# Patient Record
Sex: Male | Born: 1955 | Race: White | Hispanic: No | Marital: Married
Health system: Midwestern US, Community
[De-identification: ages and names within clinical notes are randomized; demographics above are authoritative.]

---

## 2013-06-01 MED ORDER — PREDNISONE 10 MG PO TABS
10 MG | ORAL_TABLET | ORAL | Status: AC
Start: 2013-06-01 — End: 2013-06-11

## 2013-06-01 NOTE — Progress Notes (Signed)
Subjective:      Patient ID: Nicholas Zuniga is a 57 y.o. male.    Back Pain  This is a new problem. The current episode started more than 1 month ago. The problem occurs constantly. The problem is unchanged. The pain is present in the gluteal. The quality of the pain is described as burning. The pain does not radiate. The pain is moderate. The symptoms are aggravated by sitting. Stiffness is present all day. The treatment provided mild relief.     Patient is a recent cardiac surgery back in May 29.    Since that time patient reports low back pain with right anterior thigh pain and left second and third toes numbness and tingling.  remainder of left leg is light past    Pain is worse with sitting goes away completely with lying down is aggravated moderately with activity especially stair    History of relief while taking Medrol Dosepak  Review of Systems   Musculoskeletal: Positive for back pain.       Objective:   Physical Exam   Constitutional: He is oriented to person, place, and time. He appears well-developed and well-nourished.   HENT:   Head: Normocephalic and atraumatic.   Eyes: Conjunctivae and EOM are normal.   Neck: Normal range of motion.   Cardiovascular:   Weak pedal pulses    Mild dependent rubor   Pulmonary/Chest: Effort normal. No respiratory distress.   Neurological: He is alert and oriented to person, place, and time. He has normal strength. No sensory deficit.   Normal gait   Skin: Skin is warm and dry.   Psychiatric: His behavior is normal. Thought content normal.   Nursing note and vitals reviewed.    MRI lumbar spine as well as AP lateral lumbar spine obtained and reviewed by myself in clinic today patient at L5-S1 disc space collapse some degree of foraminal stenosis hips appear normal  Assessment:      Encounter Diagnoses   Name Primary?   ??? Low back pain Yes   ??? Lumbar radicular pain    ??? Right hip pain    ??? PAD (peripheral artery disease) (HCC)             Plan:      Noninvasive vascular  studies    prednisone    Lumbar epidural steroid injections    Physical therapy

## 2013-06-02 NOTE — Addendum Note (Signed)
Addended by: Precious ReelLOPEZ, KARA on: 06/02/2013 11:02 AM     Modules accepted: Medications

## 2013-06-07 NOTE — Telephone Encounter (Signed)
Was here last week..has P.T. And epidurals ordered. Epidurals won't start till 9/8. 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 Please call back to advise if he can get work extension.

## 2013-06-10 NOTE — Telephone Encounter (Signed)
Per Dr. Dion BodyBeeks okay to write. Wrote letter will give to [place]

## 2013-06-10 NOTE — Telephone Encounter (Signed)
Ok give note

## 2013-06-10 NOTE — Telephone Encounter (Signed)
Patient called in on 8/18, some how the message was closed in error:            Expand All Collapse All   Was here last week..has P.T. And epidurals ordered. Epidurals won't start till 9/8. 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 Please call back to advise if he can get work extension.           Patient would like a note keeping him off work until 07/21/13, so PT and Injections will be done.   Please give the letter to Zella BallRobin to take home, patient to pick up at her home.

## 2013-06-16 MED ORDER — HYDROCODONE-ACETAMINOPHEN 5-325 MG PO TABS
5-325 MG | ORAL_TABLET | ORAL | Status: AC | PRN
Start: 2013-06-16 — End: 2013-06-23

## 2013-06-16 NOTE — Telephone Encounter (Signed)
Patient calling to get Norco refill said you gave it to him before. I do not see anything in our system that you gave patient Norco> Okay to call in?

## 2013-06-16 NOTE — Telephone Encounter (Signed)
Per Dr. Dion BodyBeeks okay call in #[phone]

## 2013-06-16 NOTE — Telephone Encounter (Signed)
Called in prescription, and called patient to let know had to leave message.

## 2013-06-28 LAB — URINE DRUG SCREEN
Amphetamine Screen, Ur: NEGATIVE
Barbiturate Screen, Ur: NEGATIVE
Benzodiazepine Screen, Urine: NEGATIVE
Cannabinoid Scrn, Ur: NEGATIVE
Cocaine Metabolite, Urine: NEGATIVE
Methadone Screen, Urine: NEGATIVE
Opiates, Urine: NEGATIVE
Oxycodone Screen, Ur: NEGATIVE
Phencyclidine, Urine: NEGATIVE

## 2013-06-28 MED ORDER — GABAPENTIN 400 MG PO CAPS
400 MG | ORAL_CAPSULE | Freq: Three times a day (TID) | ORAL | Status: DC
Start: 2013-06-28 — End: 2017-12-19

## 2013-06-28 NOTE — Progress Notes (Signed)
[name]  Patient Pain Assessment  Consultation - Nicholas Holts, MD    Primary Care Physician: Dorinda Hill, DO    Chief complaint:   Chief Complaint   Patient presents with   ??? Back Pain     right leg   .    HISTORY OF PRESENT ILLNESS:    Nicholas Zuniga is 57 y.o. male with chief c/o lower back pain that radiates to his right leg. 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 His lower back pain has improved slightly since the last visit to his spine surgeon. He is not on any pain medications now. He is quite active and has started feeling better. He c/o tingling and numbness in his right lateral thigh since the recent CABG surgery.     He suffered a small heart attack prior to he CABG. He notes his low back pain is of recent onset and does not recall any prior injury, or discomfort.  He was driving a truck and felt ' empty in arms, nothing in his chest, and a pain in his chest, worked 6 hours and went to the hospital. He was told he had an MI, given Plavix, he had a cath done and surgery following that.     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 He is off of work now since his CABG. He is a smoker.     Back Pain  This is a new problem. Episode onset: since 3 months. The problem occurs 2 to 4 times per day. The problem has been gradually improving since onset. The pain is present in the lumbar spine. The quality of the pain is described as aching, stabbing and shooting. The pain radiates to the right thigh. The pain is at a severity of 1/10. The pain is mild. The pain is worse during the day. The symptoms are aggravated by sitting, standing, twisting, position and bending (no pain with lying down.). Stiffness is present in the morning. Associated symptoms include leg pain, numbness and tingling (low back, right thigh, 2nd,3rd toes numb). Pertinent negatives include no fever, paresis,  paresthesias, perianal numbness, weakness or weight loss. Risk factors: chronic smoker. He has tried analgesics, bed rest, home exercises and walking for the symptoms. The treatment provided moderate relief.       OARRS compliant? not applicable  Concern for prescription abuse?not applicable    Current Pain Assement  Pain Assessment  Pain Assessment: 0-10  Pain Level: 1  Pain Type: Chronic pain  Pain Location: Back, Leg  Pain Orientation: Right  Pain Radiating Towards:  (down front of right leg)  Pain Descriptors: Spasm, Shooting, Sharp, Constant, Aching, Numbness (knife like, electric, numb right thigh, mid numb)  Pain Frequency: Intermittent  Pain Onset: On-going (June 4, when he left the hospital pain)  Clinical Progression: Not changed  Effect of Pain on Daily Activities:  (usually the next day after doing activities cutting wood, severe pain, stops me.)  Patient's Stated Pain Goal: 1 (Get rid of the numbness in right leg)  Pain Intervention(s): Medication (see eMar), Rest, Repositioned, Heat applied  Response to Pain Intervention: None       Past Medical History      Diagnosis Date   ??? Hypertension    ??? Hyperlipidemia    ??? Heart attack (HCC)    ??? Osteoarthritis    ??? CAD (coronary  artery disease)        Surgical History  Past Surgical History   Procedure Laterality Date   ??? Neck surgery     ??? Cardiac surgery       open heart, triple bypass       Medications  Current Outpatient Prescriptions   Medication Sig Dispense Refill   ??? b complex vitamins capsule Take 1 capsule by mouth daily.       ??? vitamin D (ERGOCALCIFEROL) 400 UNITS CAPS Take 400 Units by mouth daily.       ??? gabapentin (NEURONTIN) 400 MG capsule Take 1 capsule by mouth 3 times daily for 30 days. Take one at bed time x 3 days then one twice a day 6am 6pm x 3 days then one three times a day 6am 2pm 10pm  90 capsule  2   ??? aspirin 81 MG tablet Take 81 mg by mouth daily.       ??? Cimetidine (ACID REDUCER PO) Take 20.6 mg by mouth.       ???  HYDROcodone-acetaminophen (NORCO) 5-325 MG per tablet Take 1 tablet by mouth every 6 hours as needed for Pain.       ??? metoprolol (LOPRESSOR) 100 MG tablet Take 100 mg by mouth 2 times daily.       ??? atorvastatin (LIPITOR) 40 MG tablet Take 40 mg by mouth daily.       ??? omeprazole (PRILOSEC) 20 MG capsule Take 20 mg by mouth daily.         No current facility-administered medications for this encounter.       Allergies  Review of patient's allergies indicates no known allergies.    Family History  family history includes Cancer in his father.    Social History  History     Social History   ??? Marital Status: Single     Spouse Name: N/A     Number of Children: N/A   ??? Years of Education: N/A     Occupational History   ??? truck Environmental health practitioner     Social History Main Topics   ??? Smoking status: Current Every Day Smoker -- 0.25 packs/day for 45 years     Types: Cigarettes   ??? Smokeless tobacco: Never Used   ??? Alcohol Use: 7.2 oz/week     12 Cans of beer per week      Comment: 12 beers a week   ??? Drug Use: No   ??? Sexual Activity: None     Other Topics Concern   ??? None     Social History Narrative      reports that he does not use illicit drugs.         ADVERSE MEDICATION EFFECTS:   Constipation: no  Bowel Regimen: Yes  Diet: low fat, low sodium and low cholesterol  Sedation:  no  Urinary Retention: no    FOCUSED PAIN SCALE:  Highest : 10  Lowest :1  Average: Range-2    ACTIVITY/SOCIAL/EMOTIONAL:  Sleep Pattern: 8 hours per night. generally restful sleep  Home Exercises: three times a week swimming, yard work and aquatic therapy  Currently attending Physical Therapy:  Yes, aquatic therapy  When and What  was your last procedure: N/A     Was your procedure effective:  not applicable  Emotional Issues: anger.   Currently seeing a Psychiatrist or Psychologist:  No  Appetite:  ok  Energy Level:  Normal  Mobility: pain right knee, right thigh  numb    Mood: appropriate     ABERRANT BEHAVIORS SINCE LAST VISIT:  Have you  ever been treated in another Pain Clinic no  Refills for prescriptions appropriate: not applicable  Lost rx/pills: not applicable  Taking more medication than prescribed:  no  Are you receiving PAIN medications from  other doctors: yes  Urine Drug Screen or pill count compliant:  not applicable  Brought pill bottles in :no  Recent ER visits: Yes, May 2014  MI, open heart triple bypass    REVIEW OF SYSTEMS:  Review of Systems   Constitutional: Negative.  Negative for fever and weight loss.   HENT: Negative.    Eyes: Negative.    Respiratory: Negative.    Cardiovascular: Negative.    Genitourinary: Negative.    Musculoskeletal: Positive for back pain and joint pain (right knee).   Skin: Negative.    Neurological: Positive for tingling (low back, right thigh, 2nd,3rd toes numb) and numbness. Negative for seizures, weakness and paresthesias.   Endo/Heme/Allergies: Negative.    Psychiatric/Behavioral: Negative.             GENERAL PHYSICAL EXAM:  Vitals: BP 146/87   Pulse 88   Temp(Src) 98 ??F (36.7 ??C) (Oral)   Resp 20   Ht 5\' 7"  (1.702 m)   Wt 190 lb (86.183 kg)   BMI 29.75 kg/m2   SpO2 96%, Body mass index is 29.75 kg/(m^2).  GENERAL APPEARANCE: Appears well nourished and in no acute distress and not in severe pain at this time.   SKIN: Warm, dry, no cyanosis or jaundice.   HEAD: Normocephalic, atraumatic, no swelling or tenderness.   EYES: Pupils equal, reactive to light.   EARS: No discharge, no marked hearing loss.   NOSE: No rhinorrhea, epistaxis or septal deformity.   THROAT: Not congested. No ulceration bleeding or discharge.   NECK: No stiffness, trachea central. No palpable masses or L.N.   CHEST: Symmetrical and equal on expansion.   HEART:  No audible murmurs or gallops.   LUNGS: Equal on expansion, normal breath sounds. No adventitious sounds.   ABDOMEN: Soft on palpation. No localized tenderness. No guarding or rigidity. No palpable organomegaly.   LYMPHATICS: No palpable cervical lymphadenopathy.      LOCOMOTOR, BACK/ SPINE and EXTREMITIES:   NEUROLOGICAL:  Back Exam     Tenderness   The patient is experiencing tenderness in the lumbar.    Range of Motion   Extension: normal   Flexion: normal   Lateral Bend Right: normal   Lateral Bend Left: normal   Rotation Right: abnormal   Rotation Left: abnormal     Muscle Strength   Right Quadricep:  5/5   Left Quadricep:  5/5   Right Hamstring:  5/5   Left amstring:  5/5     Tests   Straight leg raise right: positive         Reflexes   Patellar: 2/4  Achilles: Hyporeflexic (on the right it is 0-1+ and 2+ on the left. )    Other   Toe Walk: normal  Heel Walk: normal  Sensation: decreased (Right lateral thigh)  Gait: normal   Erythema: no back redness  Scars: absent          Physical Exam   Musculoskeletal:        Legs:  He has bilateral lumbar facet arthritis and tenderness. He appears to have decreased sensation along the right lateral thigh; his achilles reflex is slightly diminished on the  right compared to the left.          DATA  Labs:  No components found with this basename: iammenta,  labbarb,  labbenz,  cocainescrn,  imarthc,  labopia,  labphen        Imaging:  Radiology Images and Reports reviewed where indicated and necessary       Procedure: MRI LUMBAR WITHOUT CONTRAST    CMR 05/18/2013 [id number]   Reason for Exam: ^herniated disc with radiculopathy      FULL RESULT: MRI lumbar spine without intravenous contrast, 05/18/2013      History: Herniated desk      Comparison: None available      Technique: Multiplanar multisequence MR imaging of the lumbar spine was    performed without intravenous contrast      Findings:: There is straightening of normal lumbar lordosis seen.    Visualized spinal cord demonstrates no evidence for cord edema or    myelomalacia. Conus terminates at approximately L1 vertebral body level.    Cauda equina nerve roots follow spinal curvature      Vertebral body heights are maintained. Heterogeneous marrow signal    intensity noted with the  vertebral body hemangioma seen at T12 and L2    levels. Impression degenerative changes also seen at multiple levels with    loss of disc height and disc desiccation at L5-S1 level. Mild disc    desiccation also seen L4-5 level.      L1-L2 level: No significant central canal stenosis or neuroforaminal    narrowing is seen. Mild bilateral facet arthrosis along with ligamentum    flavum thickening noted      L2-L3 level: There is bilateral facet arthrosis noted with a broad-based    disc bulge and small foraminal level disc protrusions resulting in    minimal encroachment of the neural foramina without significant central    canal stenosis.      L3-L4 level: There is bilateral facet arthrosis noted with ligamentum    flavum thickening without significant central canal stenosis. Mild    encroachment of the inferior aspect of the neural foramina noted with a    broad-based disc bulge      L4-L5 level: Broad-based disc bulge noted with a central and left    foraminal level disc bulge resulting in moderate left-sided    neuroforaminal narrowing and mild right-sided neuroforaminal narrowing    along with mild deformity of the ventral thecal sac.      L5-S1 level: Bilateral facet arthrosis and Broad-based disc bulge noted    with the foraminal level disc protrusions resulting in mild approximation    of the bilateral S1 nerve roots. Moderate to severe bilateral    neuroforaminal narrowing is seen at this level      Paraspinous soft tissues demonstrate no discrete mass. Endplate    degenerative changes are seen at L5-S1 level.      There is mild ectasia of the abnormal aorta noted.      No evidence for abnormal intracanalicular mass or fluid collections are    seen.            IMPRESSION: Lumbar spondylotic changes with a broad-based disc bulge    and foraminal level disc protrusions noted L5-S1 level resulting in    approximation of the S1 nerve roots in moderate to severe bilateral    neuroforaminal narrowing.      Report  Electronically signed by Tonette Bihari, M.D. on 05/18/2013 6:02 PM  ASSESSMENT  KOBIE WHIDBY is a 57 y.o. male with    1. Low back pain    2. Lumbar radicular pain    3. Degeneration of lumbar or lumbosacral intervertebral disc    4. Lumbosacral spondylosis without myelopathy         PLAN        Patient's  MRI studies  Reviewed.    These findings are consistent with the patient's   symptoms and physical examination.        []  Bone scan   []  EMG and nerve conduction studies   [x]  Referral reports    I also discussed  the following treatment options Including advantages and disadvantages.   [x]  Physical therapy    [x]  Interventional pain treatment   [x]  Medication management   []  Surgical options    Patient's OARRS were reviewed. It is acceptable and appears patient is not receiving prescriptions from multiple prescribers.    Patient is  forthcoming as far as receipt of prescriptions for pain medication in the past.    The following screen was  also reviewed.    We will continue current pain medications.    Current medications are being tolerated without any Adverse side effects.  Orders Placed This Encounter   Medications   ??? gabapentin (NEURONTIN) 400 MG capsule     Sig: Take 1 capsule by mouth 3 times daily for 30 days. Take one at bed time x 3 days then one twice a day 6am 6pm x 3 days then one three times a day 6am 2pm 10pm     Dispense:  90 capsule     Refill:  2       Urine drug screens have been appropriate.  No aberrant activity noted.    Analgesia is achieved.   Activities of daily living are possible because of medications.     Safe use of medications explained to patient.     SOAPP- the score is 1  Which indicates patient is  <31minimal potential  4-7 Moderate potential  >7 High potential  for drug addiction.      Counselling/Preventive measures for pain  Control:    [x]   Spine strengthening exercises are discussed with patient in detail.     [x]  Ill effects of being on chronic pain medications  such as sleep disturbances, hormonal changes, withdrawal symptoms,  chronic opioid dependence and tolerance were discussed with patient. I had asked the patient to minimize medication use and utilize pain medications only for uncontrolled rest pain or pain with exertional activities. I advised patient not to self escalate pain medications without consulting with [place].    [x] The patient's questions were answered to the best of my abilities.    Return in  as needed; after completing physical therapy, for a quick re evaluation,   with Nicholas Zuniga M.D.  for further plan of treatment.    Dr.Mitzi Hadrian Yarbrough M.D.    Electronically signed by Karilyn Cota, MD on 06/28/2013 at 2:38 PM    Electronically signed by Karilyn Cota, MD on 06/28/2013 at 2:19 PM

## 2013-06-28 NOTE — Discharge Instructions (Signed)
It is now necessary for you to bring medications precsribed by this office with you to each visit. Please ensure that all medications are in the original bottles.   Failure to comply will result in a delay in the release of future medications.     Prescription given to pt. For neurontin

## 2013-07-02 LAB — OPIATE, QUANTITATIVE, URINE
Codeine, Urine: <20 ng/mL
Hydrocodone, Urine Confirmation: 233 ng/mL
Hydromorphone, Urine Confirmation: <20 ng/mL
Morphine, Urine Confirmation: <20 ng/mL
Norhydrocodone, Urine: 132 ng/mL
Noroxycodone, Urine: <20 ng/mL
Noroxymorphone, Urine: <20 ng/mL
Opiate, 6-AM Urine: <20 ng/mL
Opiate, Urine Interpretation: POSITIVE
Oxycodone, Urine Confirmation: <20 ng/mL
Oxymorphone, Urine Confirmation: <20 ng/mL

## 2013-07-23 MED ORDER — SODIUM CHLORIDE 0.9% INTERMITTENT INFUSION
0.9 % | Freq: Once | INTRAVENOUS | Status: AC
Start: 2013-07-23 — End: 2013-07-23
  Administered 2013-07-23: 13:00:00 via INTRAVENOUS

## 2013-07-23 MED ORDER — METOPROLOL TARTRATE 1 MG/ML IV SOLN
1 MG/ML | INTRAVENOUS | Status: DC | PRN
Start: 2013-07-23 — End: 2013-07-24

## 2013-07-23 MED ORDER — NITROGLYCERIN 0.4 MG SL SUBL
0.4 MG | SUBLINGUAL | Status: DC | PRN
Start: 2013-07-23 — End: 2013-07-24

## 2013-07-23 MED ORDER — NORMAL SALINE FLUSH 0.9 % IV SOLN
0.9 % | INTRAVENOUS | Status: DC | PRN
Start: 2013-07-23 — End: 2013-07-24
  Administered 2013-07-23: 13:00:00 via INTRAVENOUS

## 2013-07-23 NOTE — Procedures (Signed)
[facility]                     [address] [place], [place] [address]                                [phone]                       Cardiovascular Diagnostics Department    Patient Name: Nicholas Zuniga, Nicholas Zuniga                    Exam Date: 07/23/2013  CI Number: [id number]                             Document ID: [id number]  Visit Number: [id number]                        Age: 58  MR Number: [id number]                              DOB: 1956-07-29  Ordering Physician:                               Gender: M  PCP: Dorinda Hill, D.O.                          Room: OP  Attending Physician: Dorinda Hill, D.O.          Location: OP      TEST TYPE:  RADIOISOTOPE STRESS STUDY    INDICATION FOR STUDY:  CORONARY ARTERY DISEASE, HTN    INTERPRETATION    100% MAX PREDICTED HR:  162 bpm  85% OF MAX PREDICTED HR: 137 bpm  RESTING HEART RATE: 73 bpm  MAXIMUM HR ACHIEVED: 148 bpm    EQUAL TO 91% OF AGE PREDICTED MAXIMUM  RESTING BLOOD PRESSURE:  163/60.  PEAK BLOOD PRESSURE:   217/98.  PEAK DOUBLE PRODUCT:  25,366  REASON FOR TERMINATION:  Criteria reached.  NUMBER OF MINUTES ON THE TREADMILL:  8 minutes and 10 seconds.  PROTOCOL:  Bruce.  METS:  10.4  RESTING ECG:  Abnormal.  STRESS HEART RESPONSE:  Normal response.  STRESS BP RESPONSE:  Appropriate.  STRESS ECG's:  Normal.  CHEST DISCOMFORT:  No pain during stress.  ISCHEMIC ECG CHANGES:  None.      FINAL IMPRESSION:  Electrocardiographically Negative Stress Study.  Nuclear  scan report to follow.      "In order to promptly notify physicians concerning their patients, this  document is being released. It is not considered final until the  physician's authentication is found below."      Armond Hang, M.D. dv  Dict: 07/23/2013  Trans: 07/23/2013 02:18 P    cc:

## 2013-07-27 NOTE — Progress Notes (Signed)
Subjective:      Patient ID: Nicholas Zuniga is a 58 y.o. male.    HPI  Please refer to previous clinic note    Patient reports he was improving by the time he was seen by pain management they started him on Neurontin rather than giving him epidurals    Patient reports his pain has subsequently completely resolved   Review of Systems    Objective:   Physical Exam   Constitutional: He is oriented to person, place, and time. He appears well-developed and well-nourished.   HENT:   Head: Normocephalic and atraumatic.   Eyes: Conjunctivae and EOM are normal.   Neck: Normal range of motion.   Pulmonary/Chest: Effort normal. No respiratory distress.   Neurological: He is alert and oriented to person, place, and time. He has normal strength. No sensory deficit.   Normal gait   Skin: Skin is warm and dry.   Psychiatric: His behavior is normal. Thought content normal.   Nursing note and vitals reviewed.  Noninvasive vascular studies look good    Assessment:      Encounter Diagnoses   Name Primary?   ??? Degeneration of lumbar or lumbosacral intervertebral disc Yes   ??? Lumbosacral spondylosis without myelopathy    ??? Right hip pain    ??? Lumbar radicular pain    ??? Low back pain    ??? PAD (peripheral artery disease) (HCC)             Plan:      Followup as needed

## 2015-04-27 LAB — CBC WITH AUTO DIFFERENTIAL
Absolute Eos #: 0.3 10*3/uL (ref 0.0–0.4)
Absolute Lymph #: 2.5 10*3/uL (ref 1.0–4.8)
Absolute Mono #: 1.2 10*3/uL (ref 0.1–1.3)
Basophils Absolute: 0.1 10*3/uL (ref 0.0–0.2)
Basophils: 1 % (ref 0–2)
Eosinophils %: 3 % (ref 0–4)
Hematocrit: 47.1 % (ref 41–53)
Hemoglobin: 15.7 g/dL (ref 13.5–17.5)
Lymphocytes: 29 % (ref 24–44)
MCH: 32.4 pg (ref 26–34)
MCHC: 33.4 g/dL (ref 31–37)
MCV: 97.1 fL (ref 80–100)
MPV: 8.1 fL (ref 6.0–12.0)
Monocytes: 14 % — ABNORMAL HIGH (ref 1–7)
Platelets: 289 10*3/uL (ref 150–450)
RBC: 4.85 m/uL (ref 4.5–5.9)
RDW: 13.5 % (ref 11.5–14.9)
Seg Neutrophils: 53 % (ref 36–66)
Segs Absolute: 4.7 10*3/uL (ref 1.3–9.1)
WBC: 8.8 10*3/uL (ref 3.5–11.0)

## 2015-04-27 LAB — BUN & CREATININE
BUN: 12 mg/dL (ref 6–20)
Creatinine: 0.7 mg/dL (ref 0.70–1.20)
GFR African American: >60 mL/min (ref >60)
GFR Non-African American: >60 mL/min (ref >60)

## 2015-04-27 LAB — ELECTROLYTE PANEL
Anion Gap: 12 mmol/L (ref 9–17)
CO2: 25 mmol/L (ref 20–31)
Chloride: 98 mmol/L (ref 98–107)
Potassium: 5.1 mmol/L (ref 3.7–5.3)
Sodium: 135 mmol/L (ref 135–144)

## 2015-04-27 LAB — LIPID PANEL
Chol/HDL Ratio: 3 (ref <5)
Cholesterol: 150 mg/dL (ref <200)
HDL: 50 mg/dL (ref >40)
LDL Cholesterol: 82 mg/dL (ref 0–130)
Triglycerides: 90 mg/dL (ref <150)

## 2015-04-27 LAB — HEPATIC FUNCTION PANEL
ALT: 27 U/L (ref 5–41)
AST: 24 U/L (ref <40)
Albumin: 4.6 g/dL (ref 3.5–5.2)
Alkaline Phosphatase: 56 U/L (ref 40–129)
Bilirubin, Direct: 0.16 mg/dL (ref <0.31)
Bilirubin, Indirect: 0.46 mg/dL (ref 0.00–1.00)
Total Bilirubin: 0.62 mg/dL (ref 0.3–1.2)
Total Protein: 7.8 g/dL (ref 6.4–8.3)

## 2015-04-27 LAB — MICROSCOPIC URINALYSIS
RBC, UA: 0 /HPF
WBC, UA: 0 /HPF

## 2015-04-27 LAB — URINALYSIS
Bilirubin Urine: NEGATIVE
Glucose, Ur: NEGATIVE
Ketones, Urine: NEGATIVE
Leukocyte Esterase, Urine: NEGATIVE
Nitrite, Urine: NEGATIVE
Protein, UA: NEGATIVE
Specific Gravity, UA: 1.017 (ref 1.000–1.030)
Urine Hgb: NEGATIVE
Urobilinogen, Urine: NORMAL
pH, UA: 7.5 (ref 5.0–8.0)

## 2015-04-27 LAB — T4, FREE: Thyroxine, Free: 1.38 ng/dL (ref 0.93–1.70)

## 2015-04-27 LAB — PSA SCREENING: PSA: 0.97 ug/L (ref <4.1)

## 2015-04-27 LAB — GLUCOSE, RANDOM: Glucose: 115 mg/dL — ABNORMAL HIGH (ref 70–99)

## 2015-04-27 LAB — TSH: TSH: 1.29 mIU/L (ref 0.30–5.00)

## 2015-06-24 LAB — CULTURE, WOUND: Culture: NORMAL — AB

## 2017-12-19 ENCOUNTER — Inpatient Hospital Stay
Admission: EM | Admit: 2017-12-19 | Discharge: 2017-12-21 | Disposition: A | Discharge status: Home / Self Care | Payer: BLUE CROSS/BLUE SHIELD | Source: Other Acute Inpatient Hospital | Admitting: Family Medicine

## 2017-12-19 ENCOUNTER — Emergency Department: Payer: BLUE CROSS/BLUE SHIELD | Primary: Family Medicine

## 2017-12-19 ENCOUNTER — Emergency Department: Admit: 2017-12-19 | Payer: BLUE CROSS/BLUE SHIELD | Primary: Family Medicine

## 2017-12-19 DIAGNOSIS — J189 Pneumonia, unspecified organism: Principal | ICD-10-CM

## 2017-12-19 LAB — CBC WITH AUTO DIFFERENTIAL
Absolute Eos #: 0 10*3/uL (ref 0.0–0.4)
Absolute Lymph #: 0.7 10*3/uL — ABNORMAL LOW (ref 1.0–4.8)
Absolute Mono #: 0.6 10*3/uL (ref 0.1–1.3)
Basophils Absolute: 0.1 10*3/uL (ref 0.0–0.2)
Basophils: 2 % (ref 0–2)
Eosinophils %: 0 % (ref 0–4)
Hematocrit: 43.1 % (ref 41–53)
Hemoglobin: 15.1 g/dL (ref 13.5–17.5)
Lymphocytes: 9 % — ABNORMAL LOW (ref 24–44)
MCH: 33.1 pg (ref 26–34)
MCHC: 34.9 g/dL (ref 31–37)
MCV: 94.9 fL (ref 80–100)
MPV: 7 fL (ref 6.0–12.0)
Monocytes: 8 % — ABNORMAL HIGH (ref 1–7)
Platelets: 201 10*3/uL (ref 150–450)
RBC: 4.54 m/uL (ref 4.5–5.9)
RDW: 13 % (ref 11.5–14.9)
Seg Neutrophils: 81 % — ABNORMAL HIGH (ref 36–66)
Segs Absolute: 6.1 10*3/uL (ref 1.3–9.1)
WBC: 7.5 10*3/uL (ref 3.5–11.0)

## 2017-12-19 LAB — COMPREHENSIVE METABOLIC PANEL
ALT: 28 U/L (ref 5–41)
AST: 32 U/L (ref <40)
Albumin: 3.1 g/dL — ABNORMAL LOW (ref 3.5–5.2)
Alkaline Phosphatase: 50 U/L (ref 40–129)
Anion Gap: 11 mmol/L (ref 9–17)
BUN: 15 mg/dL (ref 8–23)
CO2: 23 mmol/L (ref 20–31)
Calcium: 8.3 mg/dL — ABNORMAL LOW (ref 8.6–10.4)
Chloride: 87 mmol/L — ABNORMAL LOW (ref 98–107)
Creatinine: 1 mg/dL (ref 0.70–1.20)
GFR African American: >60 mL/min (ref >60)
GFR Non-African American: >60 mL/min (ref >60)
Glucose: 110 mg/dL — ABNORMAL HIGH (ref 70–99)
Potassium: 4.2 mmol/L (ref 3.7–5.3)
Sodium: 121 mmol/L — ABNORMAL LOW (ref 135–144)
Total Bilirubin: 0.33 mg/dL (ref 0.3–1.2)
Total Protein: 6.8 g/dL (ref 6.4–8.3)

## 2017-12-19 LAB — PROTIME-INR
INR: 1
Protime: 12.9 s (ref 11.8–14.6)

## 2017-12-19 LAB — TROPONIN: Troponin, High Sensitivity: 16 ng/L (ref 0–22)

## 2017-12-19 LAB — TSH WITH REFLEX: TSH: 1.95 mIU/L (ref 0.30–5.00)

## 2017-12-19 LAB — BRAIN NATRIURETIC PEPTIDE: Pro-BNP: 1159 pg/mL — ABNORMAL HIGH (ref <300)

## 2017-12-19 LAB — LACTIC ACID: Lactic Acid: 1.4 mmol/L (ref 0.5–2.2)

## 2017-12-19 MED ORDER — MAGNESIUM HYDROXIDE 400 MG/5ML PO SUSP
400 MG/5ML | Freq: Every day | ORAL | Status: DC | PRN
Start: 2017-12-19 — End: 2017-12-21

## 2017-12-19 MED ORDER — AZITHROMYCIN 500 MG IV SOLR
500 MG | INTRAVENOUS | Status: DC
Start: 2017-12-19 — End: 2017-12-21
  Administered 2017-12-20: 19:00:00 500 mg via INTRAVENOUS

## 2017-12-19 MED ORDER — NORMAL SALINE FLUSH 0.9 % IV SOLN
0.9 | Freq: Two times a day (BID) | INTRAVENOUS | Status: DC
Start: 2017-12-19 — End: 2017-12-21
  Administered 2017-12-20 (×2): 10 mL via INTRAVENOUS

## 2017-12-19 MED ORDER — ACETAMINOPHEN 325 MG PO TABS
325 MG | ORAL | Status: DC | PRN
Start: 2017-12-19 — End: 2017-12-21

## 2017-12-19 MED ORDER — ROSUVASTATIN CALCIUM 5 MG PO TABS
5 MG | ORAL | Status: DC
Start: 2017-12-19 — End: 2017-12-21
  Administered 2017-12-21: 13:00:00 5 mg via ORAL

## 2017-12-19 MED ORDER — IPRATROPIUM-ALBUTEROL 0.5-2.5 (3) MG/3ML IN SOLN
RESPIRATORY_TRACT | Status: DC
Start: 2017-12-19 — End: 2017-12-21
  Administered 2017-12-20 – 2017-12-21 (×7): 1 via RESPIRATORY_TRACT

## 2017-12-19 MED ORDER — SODIUM CHLORIDE 0.9 % IV SOLN
0.9 % | INTRAVENOUS | Status: DC
Start: 2017-12-19 — End: 2017-12-21
  Administered 2017-12-19 – 2017-12-21 (×5): via INTRAVENOUS

## 2017-12-19 MED ORDER — NORMAL SALINE FLUSH 0.9 % IV SOLN
0.9 % | INTRAVENOUS | Status: DC | PRN
Start: 2017-12-19 — End: 2017-12-19

## 2017-12-19 MED ORDER — PERFLUTREN LIPID MICROSPHERE IV SUSP
Freq: Once | INTRAVENOUS | Status: DC | PRN
Start: 2017-12-19 — End: 2017-12-21

## 2017-12-19 MED ORDER — SODIUM CHLORIDE 0.9 % IV BOLUS
0.9 % | Freq: Once | INTRAVENOUS | Status: AC
Start: 2017-12-19 — End: 2017-12-19
  Administered 2017-12-19: 19:00:00 80 mL via INTRAVENOUS

## 2017-12-19 MED ORDER — IOVERSOL 74 % IJ SOLN
74 % | Freq: Once | INTRAMUSCULAR | Status: AC | PRN
Start: 2017-12-19 — End: 2017-12-19
  Administered 2017-12-19: 19:00:00 75 mL via INTRAVENOUS

## 2017-12-19 MED ORDER — ONDANSETRON HCL 4 MG/2ML IJ SOLN
4 MG/2ML | Freq: Four times a day (QID) | INTRAMUSCULAR | Status: DC | PRN
Start: 2017-12-19 — End: 2017-12-21

## 2017-12-19 MED ORDER — PANTOPRAZOLE SODIUM 40 MG PO TBEC
40 MG | Freq: Every day | ORAL | Status: DC
Start: 2017-12-19 — End: 2017-12-21
  Administered 2017-12-20 – 2017-12-21 (×2): 40 mg via ORAL

## 2017-12-19 MED ORDER — NORMAL SALINE FLUSH 0.9 % IV SOLN
0.9 % | Freq: Two times a day (BID) | INTRAVENOUS | Status: DC
Start: 2017-12-19 — End: 2017-12-19

## 2017-12-19 MED ORDER — DEXTROSE 5 % IV SOLN (MINI-BAG)
5 % | Freq: Once | INTRAVENOUS | Status: AC
Start: 2017-12-19 — End: 2017-12-19
  Administered 2017-12-19: 19:00:00 500 mg via INTRAVENOUS

## 2017-12-19 MED ORDER — INFLUENZA VAC SPLIT QUAD 0.5 ML IM SUSY
0.5 ML | Freq: Once | INTRAMUSCULAR | Status: AC
Start: 2017-12-19 — End: 2017-12-21
  Administered 2017-12-21: 13:00:00 0.5 mL via INTRAMUSCULAR

## 2017-12-19 MED ORDER — ASPIRIN 81 MG PO TBEC
81 MG | Freq: Every day | ORAL | Status: DC
Start: 2017-12-19 — End: 2017-12-21
  Administered 2017-12-19 – 2017-12-21 (×3): 81 mg via ORAL

## 2017-12-19 MED ORDER — NORMAL SALINE FLUSH 0.9 % IV SOLN
0.9 % | INTRAVENOUS | Status: DC | PRN
Start: 2017-12-19 — End: 2017-12-21
  Administered 2017-12-19: 19:00:00 10 mL via INTRAVENOUS

## 2017-12-19 MED ORDER — DEXTROSE 5 % IV SOLN (MINI-BAG)
5 % | Freq: Once | INTRAVENOUS | Status: AC
Start: 2017-12-19 — End: 2017-12-19
  Administered 2017-12-19: 19:00:00 1 g via INTRAVENOUS

## 2017-12-19 MED ORDER — ONDANSETRON HCL 4 MG/2ML IJ SOLN
4 MG/2ML | Freq: Three times a day (TID) | INTRAMUSCULAR | Status: DC | PRN
Start: 2017-12-19 — End: 2017-12-19

## 2017-12-19 MED ORDER — NORMAL SALINE FLUSH 0.9 % IV SOLN
0.9 % | INTRAVENOUS | Status: DC | PRN
Start: 2017-12-19 — End: 2017-12-21

## 2017-12-19 MED ORDER — METHYLPREDNISOLONE SODIUM SUCC 125 MG IJ SOLR
125 MG | Freq: Three times a day (TID) | INTRAMUSCULAR | Status: DC
Start: 2017-12-19 — End: 2017-12-21
  Administered 2017-12-19 – 2017-12-21 (×6): 80 mg via INTRAVENOUS

## 2017-12-19 MED ORDER — ENOXAPARIN SODIUM 40 MG/0.4ML SC SOLN
40 | Freq: Every day | SUBCUTANEOUS | Status: DC
Start: 2017-12-19 — End: 2017-12-21
  Administered 2017-12-19 – 2017-12-20 (×2): 40 mg via SUBCUTANEOUS

## 2017-12-19 MED ORDER — ENOXAPARIN SODIUM 40 MG/0.4ML SC SOLN
40 MG/0.4ML | Freq: Every day | SUBCUTANEOUS | Status: DC
Start: 2017-12-19 — End: 2017-12-19

## 2017-12-19 MED ORDER — SODIUM CHLORIDE 0.9 % IV BOLUS
0.9 % | Freq: Once | INTRAVENOUS | Status: AC
Start: 2017-12-19 — End: 2017-12-19
  Administered 2017-12-19: 19:00:00 1000 mL via INTRAVENOUS

## 2017-12-19 MED ORDER — SODIUM CHLORIDE 0.9 % IV BOLUS
0.9 % | Freq: Once | INTRAVENOUS | Status: AC
Start: 2017-12-19 — End: 2017-12-19
  Administered 2017-12-19: 18:00:00 1000 mL via INTRAVENOUS

## 2017-12-19 MED ORDER — ACETAMINOPHEN 325 MG PO TABS
325 MG | ORAL | Status: DC | PRN
Start: 2017-12-19 — End: 2017-12-19

## 2017-12-19 MED ORDER — DEXTROSE 5 % IV SOLN (MINI-BAG)
5 % | INTRAVENOUS | Status: DC
Start: 2017-12-19 — End: 2017-12-21
  Administered 2017-12-20: 18:00:00 1 g via INTRAVENOUS

## 2017-12-19 MED FILL — ASPIRIN EC 81 MG PO TBEC: 81 MG | ORAL | Qty: 1

## 2017-12-19 MED FILL — IPRATROPIUM-ALBUTEROL 0.5-2.5 (3) MG/3ML IN SOLN: RESPIRATORY_TRACT | Qty: 3

## 2017-12-19 MED FILL — LOVENOX 40 MG/0.4ML SC SOLN: 40 MG/0.4ML | SUBCUTANEOUS | Qty: 0.4

## 2017-12-19 MED FILL — DEXTROSE 5 % IV SOLN: 5 % | INTRAVENOUS | Qty: 250

## 2017-12-19 MED FILL — ROSUVASTATIN CALCIUM 5 MG PO TABS: 5 MG | ORAL | Qty: 1

## 2017-12-19 MED FILL — CEFTRIAXONE SODIUM 1 G IJ SOLR: 1 g | INTRAMUSCULAR | Qty: 1

## 2017-12-19 MED FILL — SOLU-MEDROL 125 MG IJ SOLR: 125 MG | INTRAMUSCULAR | Qty: 125

## 2017-12-19 MED FILL — AZITHROMYCIN 500 MG IV SOLR: 500 MG | INTRAVENOUS | Qty: 500

## 2017-12-19 MED FILL — DEXTROSE 5 % IV SOLN: 5 % | INTRAVENOUS | Qty: 50

## 2017-12-19 NOTE — Plan of Care (Signed)
Problem: Falls - Risk of:  Goal: Will remain free from falls  Will remain free from falls   Outcome: Ongoing  Pt alert able to verbalize his needs,up per self in room    Problem: Breathing Pattern - Ineffective:  Goal: Ability to achieve and maintain a regular respiratory rate will improve  Ability to achieve and maintain a regular respiratory rate will improve   Outcome: Ongoing  Monitor respiratory assessment,esp tx and meds as ordered

## 2017-12-19 NOTE — Progress Notes (Signed)
Medication History completed:    New medications: albuterol inhaler, carvedilol, levofloxacin, multivitamin, rosuvastatin, Hyzaar, aspirin    Medications discontinued: vitamin D, metoprolol, atorvastatin, Norco, gabapentin, cimetidine, b complex vitamin    Changes to dosing: omeprazole changed to 40 mg daily    Stated allergies: NKDA    Other pertinent information: Medications confirmed with [facility]. The patient took his last dose of prednisone 20 mg today (5 day burst started 12/14/17). He started levofloxacin on 12/14/17 with a planned 10 day course.    Thank you,  Julien GirtMelanie Lowers, PharmD  [phone]

## 2017-12-19 NOTE — ED Notes (Signed)
Bed: 09  Expected date:   Expected time:   Means of arrival:   Comments:     Arminda Resides, RN  12/19/17 1149

## 2017-12-19 NOTE — ED Notes (Signed)
IV established. Blood draw unsuccessful. Lab paged for blood draw attempt.      Dan EuropeErin M Schwamberger, RN  12/19/17 1240

## 2017-12-19 NOTE — ED Notes (Signed)
Report given to Selena BattenKim, RN from [facility].   Report method on phone.    The following was reviewed with receiving RN:   Current vital signs:  BP 104/66    Pulse 83    Temp 97.6 ??F (36.4 ??C) (Oral)    Resp 23    Ht 5\' 7"  (1.702 m)    Wt 200 lb (90.7 kg)    SpO2 94%    BMI 31.32 kg/m??                MEWS Score: 2     Any medication or safety alerts were reviewed. Any pending diagnostics and notifications were also reviewed, as well as any safety concerns or issues, abnormal labs, abnormal imaging, and abnormal assessment findings. Questions were answered.      Dan EuropeErin M Schwamberger, RN  12/19/17 [phone]

## 2017-12-19 NOTE — ED Provider Notes (Signed)
[facility] ED  eMERGENCY dEPARTMENT eNCOUnter      Pt Name: Nicholas Zuniga  MRN: [id number]  Birthdate 09-28-1956  Date of evaluation: 12/19/17      CHIEF COMPLAINT       Chief Complaint   Patient presents with   ??? Shortness of Breath     HISTORY OF PRESENT ILLNESS   HPI 62 y.o. male  presents with complaints of shortness of breath cough and generalized weakness.  Symptoms of been going on for about the last week.  About 5 days ago was seen at an outside emergency department and was diagnosed with COPD, he is started on a course of prednisone, levofloxacin and albuterol.  Despite that his shortness of breath cough have worsened, he's had generalized weakness.  He denies any pain.  He went and saw his regular doctor today, who told him that he thought he was septic and needed to go to the emergency department.    REVIEW OF SYSTEMS       Review of Systems   Constitutional: Positive for activity change, appetite change, fatigue and fever.   HENT: Negative for congestion and rhinorrhea.    Respiratory: Positive for cough (non-productive) and shortness of breath.    Cardiovascular: Negative for chest pain and leg swelling.   Gastrointestinal: Negative for abdominal pain, nausea and vomiting.   Genitourinary: Negative for decreased urine volume.   Musculoskeletal: Negative for back pain.   Skin: Negative for rash.   Neurological: Positive for weakness.       PAST MEDICAL HISTORY     Past Medical History:   Diagnosis Date   ??? CAD (coronary artery disease)    ??? Heart attack (HCC)    ??? Hyperlipidemia    ??? Hypertension    ??? Osteoarthritis        SURGICAL HISTORY       Past Surgical History:   Procedure Laterality Date   ??? CARDIAC SURGERY      open heart, triple bypass   ??? NECK SURGERY         CURRENT MEDICATIONS       Previous Medications    ALBUTEROL SULFATE HFA 108 (90 BASE) MCG/ACT INHALER    Inhale 2 puffs into the lungs every 4 hours as needed for Wheezing     ASPIRIN 81 MG EC TABLET    Take 81 mg by mouth daily.     CARVEDILOL (COREG) 25 MG TABLET    Take 25 mg by mouth 2 times daily (with meals)    LEVOFLOXACIN (LEVAQUIN) 500 MG TABLET    Take 500 mg by mouth daily Starting 12/14/17 for 10 days    LOSARTAN-HYDROCHLOROTHIAZIDE (HYZAAR) 100-12.5 MG PER TABLET    Take 1 tablet by mouth daily    MULTIPLE VITAMINS-MINERALS (THERAPEUTIC MULTIVITAMIN-MINERALS) TABLET    Take 1 tablet by mouth daily    OMEPRAZOLE (PRILOSEC) 40 MG DELAYED RELEASE CAPSULE    Take 40 mg by mouth daily     ROSUVASTATIN (CRESTOR) 5 MG TABLET    Take 5 mg by mouth every other day       ALLERGIES     has No Known Allergies.    FAMILY HISTORY     indicated that his mother is alive. He indicated that his father is deceased.        SOCIAL HISTORY      reports that he has quit smoking. His smoking use included Cigarettes. He has a 11.25 pack-year smoking  history. He has never used smokeless tobacco. He reports that he drinks about 7.2 oz of alcohol per week . He reports that he does not use drugs.    PHYSICAL EXAM     INITIAL VITALS: BP 103/66    Pulse 83    Temp 98.1 ??F (36.7 ??C) (Temporal)    Resp 23    Ht 5\' 7"  (1.702 m)    Wt 200 lb (90.7 kg)    SpO2 99%    BMI 31.32 kg/m??   Gen.: Appears weak and tired  Head: Normocephalic, atraumatic  Eye: Pupils equal round reactive to light, no conjunctivitis  Neck: No JVD or adenopathy  Heart: Regular rate and rhythm no murmurs  Lungs: Right lower lobe rhonchi no respiratory distress  Chest wall: There is some mild spider angioma on on the upper chest wall no tenderness palpation  Abdomen: Soft, nontender, nondistended, with no peritoneal signs  Neurologic: Patient is alert and oriented x3, motor and sensation is intact in all 4 extremities, speech  Extremities: Full range of motion, no cyanosis, no edema, no signs of trauma, no tenderness to palpation    MEDICAL DECISION MAKING:     MDM  This is a 62 year old presenting with cough and generalized weakness tachycardia tachypnea.  Suspect pneumonia.  Failed outpatient  treatment of antibiotics.  On physical examination he has a right lower lobe pneumonia.  We'll check lactic acid, we'll draw blood cultures, we'll rule out any atypical presentation of ACS and evaluate for any possible heart failure but I think these are less likely diagnoses. We'll r/o a pulmonary embolism. Giving 30 mL/kg ideal body weight IV fluids and will start him on ceftriaxone and azithromycin.         Hospital course:  CT scan viewed and noted.    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    Patient reassessed he is doing better, his blood pressure stable, heart rate is improved.  Respiratory status is stable.    Discussed with Dr. Nilda Riggs    DIAGNOSTIC RESULTS     EKG: All EKG's are interpreted by the Emergency Department Physician who either signs or Co-signs this chart in the absence of a cardiologist.  EKG shows a sinus rhythm, heart rate 91, PR 140, QRS 104, QTC of 452 there is no ST segment depressions, there is ST segment elevation in lead aVR, there are T wave inversions in the precordial leads., Axis is normal      RADIOLOGY:All plain film, CT, MRI, and formal ultrasound images (except ED bedside ultrasound) are read by the radiologist and the images and interpretations are directly viewed by the emergency physician.     CT Chest Pulmonary Embolism W Contrast   Final Result   No acute or chronic pulmonary embolism.      Peripheral nodular opacities in the lungs bilaterally that is worse on the   right compared to the left with mild ground-glass attenuation in the upper   lobes and this is concerning for infectious or inflammatory process.             LABS: All lab results were reviewed by myself, and all abnormals are listed below.  Labs Reviewed   CBC WITH AUTO DIFFERENTIAL - Abnormal; Notable for the following:        Result Value    Seg Neutrophils 81 (*)     Lymphocytes 9 (*)     Monocytes  8 (*)      Absolute Lymph # 0.70 (*)     All other components within normal limits   COMPREHENSIVE METABOLIC PANEL - Abnormal; Notable for the following:     Glucose 110 (*)     Calcium 8.3 (*)     Sodium 121 (*)     Chloride 87 (*)     Alb 3.1 (*)     All other components within normal limits   BRAIN NATRIURETIC PEPTIDE - Abnormal; Notable for the following:     Pro-BNP 1,159 (*)     All other components within normal limits   CULTURE BLOOD #1   CULTURE BLOOD #1   PROTIME-INR   TROPONIN   LACTIC ACID   TSH WITH REFLEX       EMERGENCY DEPARTMENT COURSE:   Vitals:    Vitals:    12/19/17 1300 12/19/17 1315 12/19/17 1345 12/19/17 1430   BP:  101/86 114/67 103/66   Pulse: 89 87 83 83   Resp: 18 20 23 23    Temp:       TempSrc:       SpO2: 99%      Weight:       Height:           The patient was given the following medications while in the emergency department:  Orders Placed This Encounter   Medications   ??? cefTRIAXone (ROCEPHIN) 1 g IVPB in 50 mL D5W minibag   ??? azithromycin (ZITHROMAX) 500 mg in D5W 250ml addavial   ??? 0.9 % sodium chloride bolus   ??? 0.9 % sodium chloride bolus   ??? ioversol (OPTIRAY) 74 % injection 75 mL   ??? 0.9 % sodium chloride bolus   ??? sodium chloride flush 0.9 % injection 10 mL     -------------------------  CRITICAL CARE:   CONSULTS: IP CONSULT TO PRIMARY CARE PROVIDER  PROCEDURES: Procedures     FINAL IMPRESSION      1. Pneumonia due to organism    2. Hyponatremia          DISPOSITION/PLAN   DISPOSITION        PATIENT REFERRED TO:  No follow-up provider specified.    DISCHARGE MEDICATIONS:  New Prescriptions    No medications on file         Manley MasonKevin J Lanitra Battaglini, MD  Attending Emergency Physician                      Manley MasonKevin J Shannen Vernon, MD  12/19/17 [phone]

## 2017-12-19 NOTE — ED Notes (Signed)
Pt presents to ED by squad c/o SOB, cough, and generalized weakness x 5 days. Pt was evaluated for this at another facility on Sunday, and saw his PCP today because his symptoms have not improved. Pt PCP recommended that pt be seen in ED for septic workup. Squad states pt O2 sat at 86% on 5L NC in route, and then placed on nonrebreather. Pt O2 sat in 90s on arrival. Pt taken off nonrebreather and O2 sat remains in the 90s. Pt is A&O x4, PWD, eupneic, and no distress noted.      Dan EuropeErin M Schwamberger, RN  12/19/17 [phone]

## 2017-12-20 LAB — BASIC METABOLIC PANEL W/ REFLEX TO MG FOR LOW K
Anion Gap: 11 mmol/L (ref 9–17)
BUN: 10 mg/dL (ref 8–23)
CO2: 20 mmol/L (ref 20–31)
Calcium: 7.9 mg/dL — ABNORMAL LOW (ref 8.6–10.4)
Chloride: 93 mmol/L — ABNORMAL LOW (ref 98–107)
Creatinine: 0.61 mg/dL — ABNORMAL LOW (ref 0.70–1.20)
GFR African American: >60 mL/min (ref >60)
GFR Non-African American: >60 mL/min (ref >60)
Glucose: 152 mg/dL — ABNORMAL HIGH (ref 70–99)
Potassium: 4.1 mmol/L (ref 3.7–5.3)
Sodium: 124 mmol/L — ABNORMAL LOW (ref 135–144)

## 2017-12-20 LAB — EKG 12-LEAD
Atrial Rate: 91 {beats}/min
P Axis: 33 degrees
P-R Interval: 140 ms
Q-T Interval: 368 ms
QRS Duration: 104 ms
QTc Calculation (Bazett): 452 ms
R Axis: 27 degrees
T Axis: 93 degrees
Ventricular Rate: 91 {beats}/min

## 2017-12-20 LAB — CBC WITH AUTO DIFFERENTIAL
Absolute Eos #: 0 10*3/uL (ref 0.0–0.4)
Absolute Lymph #: 0.7 10*3/uL — ABNORMAL LOW (ref 1.0–4.8)
Absolute Mono #: 0.3 10*3/uL (ref 0.1–1.3)
Basophils Absolute: 0 10*3/uL (ref 0.0–0.2)
Basophils: 1 % (ref 0–2)
Eosinophils %: 0 % (ref 0–4)
Hematocrit: 41.3 % (ref 41–53)
Hemoglobin: 14.1 g/dL (ref 13.5–17.5)
Lymphocytes: 11 % — ABNORMAL LOW (ref 24–44)
MCH: 32.6 pg (ref 26–34)
MCHC: 34.2 g/dL (ref 31–37)
MCV: 95.3 fL (ref 80–100)
MPV: 7.2 fL (ref 6.0–12.0)
Monocytes: 5 % (ref 1–7)
Platelets: 202 10*3/uL (ref 150–450)
RBC: 4.34 m/uL — ABNORMAL LOW (ref 4.5–5.9)
RDW: 12.9 % (ref 11.5–14.9)
Seg Neutrophils: 83 % — ABNORMAL HIGH (ref 36–66)
Segs Absolute: 5.2 10*3/uL (ref 1.3–9.1)
WBC: 6.1 10*3/uL (ref 3.5–11.0)

## 2017-12-20 LAB — ECHOCARDIOGRAM COMPLETE 2D W DOPPLER W COLOR: Left Ventricular Ejection Fraction: 63

## 2017-12-20 MED ORDER — AYR SALINE NASAL NA GEL
NASAL | Status: DC | PRN
Start: 2017-12-20 — End: 2017-12-21

## 2017-12-20 MED FILL — SOLU-MEDROL 125 MG IJ SOLR: 125 MG | INTRAMUSCULAR | Qty: 125

## 2017-12-20 MED FILL — AZITHROMYCIN 500 MG IV SOLR: 500 mg | INTRAVENOUS | Qty: 500

## 2017-12-20 MED FILL — ASPIRIN EC 81 MG PO TBEC: 81 MG | ORAL | Qty: 1

## 2017-12-20 MED FILL — LOVENOX 40 MG/0.4ML SC SOLN: 40 MG/0.4ML | SUBCUTANEOUS | Qty: 0.4

## 2017-12-20 MED FILL — IPRATROPIUM-ALBUTEROL 0.5-2.5 (3) MG/3ML IN SOLN: RESPIRATORY_TRACT | Qty: 3

## 2017-12-20 MED FILL — PANTOPRAZOLE SODIUM 40 MG PO TBEC: 40 MG | ORAL | Qty: 1

## 2017-12-20 MED FILL — CRESTOR 5 MG PO TABS: 5 MG | ORAL | Qty: 1

## 2017-12-20 MED FILL — CEFTRIAXONE SODIUM 1 G IJ SOLR: 1 g | INTRAMUSCULAR | Qty: 1

## 2017-12-20 NOTE — Progress Notes (Signed)
Nutrition Assessment (Low Risk)    Type and Reason for Visit: Positive Nutrition Screen(Unplanned weight loss, decreased appetite)    Nutrition Recommendations: Continue Cardiac diet.      Nutrition Assessment:  Patient assessed for nutritional risk.  Deemed to be at low risk at this time.  Will continue to monitor for changes in status. Patient's p.o intakes are adequate and appetite has returned. Patient reports poor appetite for a weeks with weight loss but appears to be back at baseline. No concern for malnutrition. Continue Cardiac diet.     Malnutrition Assessment:   Malnutrition Status: At risk for malnutrition    Nutrition Risk Level  . Risk Level: Low    Nutrition Diagnosis:    Problem: Predicted suboptimal energy intake   Etiology: Insufficient energy/nutrient consumption(Acute illness)   . Signs and symptoms: Weight loss    Nutrition Intervention:  Food and/or Delivery: Continue current diet  Nutrition Education/Counseling/Coordination of Care:  Continued Inpatient Monitoring      Myrene Buddy  [facility], R.D, L.D,  Clinical Dietitian  Pager # [phone]

## 2017-12-20 NOTE — H&P (Signed)
Dr. Venora Maples                                                                       Admission Note/H&P  ??  ??  ??  Patient:  Nicholas Zuniga  Date of Birth: 06-30-1956  ??  MRN: [id number]                            Acct: [id number]   ??  Admit date: 12/19/2017  ??  Pt seen and Chart reviewed.  Consultant notes reviewed and outpatient/ ED care evaluated.  ??  Subjective: seen in the off, hypotensive ashen, diaphoretic,sob, stat 911 and transfer here  ER ct chest pneumonia, hypotensive  ??       Patient Active Problem List   Diagnosis Code   ??? PAD (peripheral artery disease) (HCC) I73.9   ??? Pneumonia J18.9   ??  ??  Diet:  DIET CARDIAC;  ??  ??  Medications:Current Inpatient  ??  Scheduled Meds:  Scheduled??Medications   ??? aspirin  81 mg Oral Daily   ??? pantoprazole  40 mg Oral QAM AC   ??? rosuvastatin  5 mg Oral Every Other Day   ??? ipratropium-albuterol  1 ampule Inhalation Q4H WA   ??? methylPREDNISolone  80 mg Intravenous Q8H   ??? sodium chloride flush  10 mL Intravenous 2 times per day   ??? enoxaparin  40 mg Subcutaneous Daily   ??? cefTRIAXone (ROCEPHIN) IV  1 g Intravenous Q24H   ??? azithromycin  500 mg Intravenous Q24H   ??? influenza virus vaccine  0.5 mL Intramuscular Once      ??  Continuous Infusions:  Infusions??Meds   ??? sodium chloride 125 mL/hr at 12/20/17 0905      ??  PRN Meds:  PRN??Medications   sodium chloride flush, sodium chloride flush, magnesium hydroxide, ondansetron, acetaminophen, perflutren lipid microspheres, saline nasal gel     ??  Objective:  ??  Physical Exam:  Vitals: BP 112/82    Pulse 86    Temp 97.6 ??F (36.4 ??C) (Oral)    Resp 18    Ht 5\' 7"  (1.702 m)    Wt 204 lb 7 oz (92.7 kg)    SpO2 92%    BMI 32.02 kg/m??   Height and weight:        Wt Readings from Last 3 Encounters:   12/20/17 204 lb 7 oz (92.7 kg)   07/23/13 190 lb (86.2 kg)   06/28/13 190 lb (86.2 kg)      @LASTENCHT @  ??  Physical Examination:   General appearance -  ill-appearing  Mental status - alert, oriented to person, place, and time  Chest - decreased air entry noted   Heart - normal rate, regular rhythm, normal S1, S2, no murmurs, rubs, clicks or gallops  Abdomen - soft, nontender, nondistended, no masses or organomegaly  Neurological - alert, oriented, normal speech, no focal findings or movement disorder noted}  Extremities - peripheral pulses normal, no pedal edema, no clubbing or cyanosis  Skin - normal coloration and turgor, no rashes, no suspicious skin lesions noted   ??  ??  Labs:-  ??  CBC:  Recent Labs     12/19/17  1257 12/20/17  0637   WBC 7.5 6.1   HGB 15.1 14.1   PLT 201 202   ??  BMP:         Recent Labs     12/19/17  1257 12/20/17  0637   NA 121* 124*   K 4.2 4.1   CL 87* 93*   CO2 23 20   BUN 15 10   CREATININE 1.00 0.61*   GLUCOSE 110* 152*   ??  Glucose:No results for input(s): POCGLU in the last 72 hours.  HgbA1C: No results for input(s): LABA1C in the last 72 hours.  INR:       Recent Labs     12/19/17  1257   INR 1.0   ??  CARDIAC ENZYMES:No results for input(s): CKTOTAL, CKMB, CKMBINDEX, TROPONINI in the last 72 hours.  BNP: No results for input(s): BNP in the last 72 hours.  Lipids: No results for input(s): CHOL, TRIG, HDL, LDLCALC in the last 72 hours.  ??  Invalid input(s): LDL  ABGs: No results found for: PH, PCO2, PO2, HCO3, O2SAT  Thyroid:         Lab Results   Component Value Date   ?? TSH 1.95 12/19/2017      Urinalysis:         Color, UA   Date Value Ref Range Status   04/27/2015 DARK YELLOW (A) YEL Final   ??        pH, UA   Date Value Ref Range Status   04/27/2015 7.5 5.0 - 8.0 Final   ??        Specific Gravity, UA   Date Value Ref Range Status   04/27/2015 1.017 1.000 - 1.030 Final   ??        Protein, UA   Date Value Ref Range Status   04/27/2015 NEGATIVE NEG Final   ??  RBC, UA   Date Value Ref Range Status   04/27/2015 0 TO 2 /HPF Final   ??          Bacteria, UA   Date Value Ref Range Status   04/27/2015 FEW (A) NONE Final   ?? ?? Comment:    ?? ?? Performed at [facility] [address]. [place], [place] [address]   [phone]  ??   ??        Nitrite, Urine   Date Value Ref Range Status   04/27/2015 NEGATIVE NEG Final   ??        WBC, UA   Date Value Ref Range Status   04/27/2015 0 TO 2 /HPF Final   ??          Leukocyte Esterase, Urine   Date Value Ref Range Status   04/27/2015 NEGATIVE NEG Final   ?? ?? Comment:   ?? ?? Performed at [facility] [address] [place], [place] [address]   [phone]  ??   ??        Yeast, UA   Date Value Ref Range Status   04/27/2015 NOT REPORTED NONE Final   ??        Glucose, Ur   Date Value Ref Range Status   04/27/2015 NEGATIVE NEG Final   ??        Bilirubin Urine   Date Value Ref Range Status   04/27/2015 NEGATIVE NEG Final   ??  ??  CULTURES:  ??  Current Rehabilitation  Assessments:  PHYSICAL THERAPY:     OCCUPATIONAL THERAPY:     SPEECH:    ??  Assessment:  Active Problems:    Pneumonia  Resolved Problems:    * No resolved hospital problems. *  ??  ??  Plan:  1. roceph and zith  2. O2, aerosols, fluids  ??  Dorinda HillMark C Lakira Ogando, MD             12/20/2017, 9:10 AM  ??

## 2017-12-20 NOTE — Progress Notes (Signed)
Dr. Venora MaplesMark Oren Barella                                                                       Admission Note        Patient:  Nicholas JewJames A Geary  Date of Birth: 1956/03/15    MRN: [id number]     Acct: [id number]     Admit date: 12/19/2017    Pt seen and Chart reviewed.  Consultant notes reviewed and outpatient/ ED care evaluated.    Subjective: seen in the off, hypotensive ashen, diaphoretic,sob, stat 911 and transfer here  ER ct chest pneumonia, hypotensive    Patient Active Problem List   Diagnosis Code   ??? PAD (peripheral artery disease) (HCC) I73.9   ??? Pneumonia J18.9       Diet:  DIET CARDIAC;      Medications:Current Inpatient    Scheduled Meds:  ??? aspirin  81 mg Oral Daily   ??? pantoprazole  40 mg Oral QAM AC   ??? rosuvastatin  5 mg Oral Every Other Day   ??? ipratropium-albuterol  1 ampule Inhalation Q4H WA   ??? methylPREDNISolone  80 mg Intravenous Q8H   ??? sodium chloride flush  10 mL Intravenous 2 times per day   ??? enoxaparin  40 mg Subcutaneous Daily   ??? cefTRIAXone (ROCEPHIN) IV  1 g Intravenous Q24H   ??? azithromycin  500 mg Intravenous Q24H   ??? influenza virus vaccine  0.5 mL Intramuscular Once     Continuous Infusions:  ??? sodium chloride 125 mL/hr at 12/20/17 0905     PRN Meds:sodium chloride flush, sodium chloride flush, magnesium hydroxide, ondansetron, acetaminophen, perflutren lipid microspheres, saline nasal gel    Objective:    Physical Exam:  Vitals: BP 112/82    Pulse 86    Temp 97.6 ??F (36.4 ??C) (Oral)    Resp 18    Ht 5\' 7"  (1.702 m)    Wt 204 lb 7 oz (92.7 kg)    SpO2 92%    BMI 32.02 kg/m??   Height and weight:    Wt Readings from Last 3 Encounters:   12/20/17 204 lb 7 oz (92.7 kg)   07/23/13 190 lb (86.2 kg)   06/28/13 190 lb (86.2 kg)      @LASTENCHT @    Physical Examination:   General appearance - ill-appearing  Mental status - alert, oriented to person, place, and time  Chest - decreased air entry noted   Heart - normal rate, regular  rhythm, normal S1, S2, no murmurs, rubs, clicks or gallops  Abdomen - soft, nontender, nondistended, no masses or organomegaly  Neurological - alert, oriented, normal speech, no focal findings or movement disorder noted}  Extremities - peripheral pulses normal, no pedal edema, no clubbing or cyanosis  Skin - normal coloration and turgor, no rashes, no suspicious skin lesions noted       Labs:-    CBC:   Recent Labs     12/19/17  1257 12/20/17  0637   WBC 7.5 6.1   HGB 15.1 14.1   PLT 201 202     BMP:    Recent Labs     12/19/17  1257 12/20/17  0637   NA 121*  124*   K 4.2 4.1   CL 87* 93*   CO2 23 20   BUN 15 10   CREATININE 1.00 0.61*   GLUCOSE 110* 152*     Glucose:No results for input(s): POCGLU in the last 72 hours.  HgbA1C: No results for input(s): LABA1C in the last 72 hours.  INR:   Recent Labs     12/19/17  1257   INR 1.0     CARDIAC ENZYMES:No results for input(s): CKTOTAL, CKMB, CKMBINDEX, TROPONINI in the last 72 hours.  BNP: No results for input(s): BNP in the last 72 hours.  Lipids: No results for input(s): CHOL, TRIG, HDL, LDLCALC in the last 72 hours.    Invalid input(s): LDL  ABGs: No results found for: PH, PCO2, PO2, HCO3, O2SAT  Thyroid:   Lab Results   Component Value Date    TSH 1.95 12/19/2017      Urinalysis:   Color, UA   Date Value Ref Range Status   04/27/2015 DARK YELLOW (A) YEL Final     pH, UA   Date Value Ref Range Status   04/27/2015 7.5 5.0 - 8.0 Final     Specific Gravity, UA   Date Value Ref Range Status   04/27/2015 1.017 1.000 - 1.030 Final     Protein, UA   Date Value Ref Range Status   04/27/2015 NEGATIVE NEG Final     RBC, UA   Date Value Ref Range Status   04/27/2015 0 TO 2 /HPF Final     Bacteria, UA   Date Value Ref Range Status   04/27/2015 FEW (A) NONE Final     Comment:     Performed at [facility] [address]. [place], [place] [address]   [phone]       Nitrite, Urine   Date Value Ref Range Status   04/27/2015 NEGATIVE NEG Final     WBC, UA   Date Value Ref  Range Status   04/27/2015 0 TO 2 /HPF Final     Leukocyte Esterase, Urine   Date Value Ref Range Status   04/27/2015 NEGATIVE NEG Final     Comment:     Performed at [facility] [address] [place], [place] [address]   [phone]       Yeast, UA   Date Value Ref Range Status   04/27/2015 NOT REPORTED NONE Final     Glucose, Ur   Date Value Ref Range Status   04/27/2015 NEGATIVE NEG Final     Bilirubin Urine   Date Value Ref Range Status   04/27/2015 NEGATIVE NEG Final       CULTURES:    Current Rehabilitation Assessments:  PHYSICAL THERAPY:     OCCUPATIONAL THERAPY:     SPEECH:      Assessment:  Active Problems:    Pneumonia  Resolved Problems:    * No resolved hospital problems. *      Plan:  1. roceph and zith  2. O2, aerosols, fluids    Dorinda Hill, MD             12/20/2017, 9:10 AM

## 2017-12-20 NOTE — Care Coordination-Inpatient (Signed)
CASE MANAGEMENT NOTE:    Admission Date:  12/19/2017 Nicholas Zuniga is a 62 y.o.  male    Admitted for : Pneumonia [J18.9]  Pneumonia [J18.9]    Met with:  Patient    PCP:  Massie Kluver                                Insurance:  BCBS      Current Residence/ Living Arrangements:  independently at home             Current Services PTA:  No    Is patient agreeable to VNS: No    Freedom of choice provided: Yes    List of [place] provided: No    VNS chosen:  No    DME:  none    Home Oxygen: No    Nebulizer: No    CPAP/BIPAP: No    Supplier: N/A    Potential Assistance Needed: No    SNF needed: No    Pharmacy:  [facility] in [place]       Is the Patient an [employer] with Readmission Risk Score greater than 14%?  No  If yes, pt needs a follow up appointment made within 7 days.    Does Patient want to use MEDS to BEDS? No    Family Members/Caregivers that pt would like involved in their care:    Yes    If yes, list name here:  Spouse Educational psychologist:  Patient             Is patient in Isolation/One on One/Altered Mental Status?    No  If yes, skip next question.  If no, would they like an I-Pad to  use?   No  If yes, call [phone].             Discharge Plan:  3/2: [employer] - independent from home with spouse and drives. DME: none. Pneumonia. Follow for neb. Declines VNS. Denies needs.                 Electronically signed by: Blythe Stanford, RN on 12/20/2017 at 12:15 PM

## 2017-12-21 LAB — BLOOD OCCULT STOOL DIAGNOSTIC: Occult Blood, Stool #1: NEGATIVE

## 2017-12-21 MED ORDER — PREDNISONE 20 MG PO TABS
20 | ORAL_TABLET | Freq: Every day | ORAL | 0 refills | Status: DC
Start: 2017-12-21 — End: 2017-12-21

## 2017-12-21 MED ORDER — PREDNISONE 20 MG PO TABS
20 MG | ORAL_TABLET | Freq: Two times a day (BID) | ORAL | 0 refills | Status: AC
Start: 2017-12-21 — End: 2017-12-26

## 2017-12-21 MED ORDER — AZITHROMYCIN 500 MG PO TABS
500 MG | PACK | Freq: Every day | ORAL | 0 refills | Status: DC
Start: 2017-12-21 — End: 2017-12-21

## 2017-12-21 MED ORDER — CEFUROXIME AXETIL 250 MG PO TABS
250 MG | ORAL_TABLET | Freq: Two times a day (BID) | ORAL | 0 refills | Status: DC
Start: 2017-12-21 — End: 2017-12-21

## 2017-12-21 MED ORDER — ALBUTEROL SULFATE HFA 108 (90 BASE) MCG/ACT IN AERS
108 (90 Base) MCG/ACT | RESPIRATORY_TRACT | 3 refills | Status: DC | PRN
Start: 2017-12-21 — End: 2017-12-21

## 2017-12-21 MED ORDER — PREDNISONE 20 MG PO TABS
20 MG | ORAL_TABLET | Freq: Every day | ORAL | 0 refills | Status: AC
Start: 2017-12-21 — End: 2017-12-31

## 2017-12-21 MED ORDER — PREDNISONE 20 MG PO TABS
20 | ORAL_TABLET | Freq: Two times a day (BID) | ORAL | 0 refills | Status: DC
Start: 2017-12-21 — End: 2017-12-21

## 2017-12-21 MED ORDER — CEFUROXIME AXETIL 250 MG PO TABS
250 MG | ORAL_TABLET | Freq: Two times a day (BID) | ORAL | 0 refills | Status: AC
Start: 2017-12-21 — End: 2017-12-31

## 2017-12-21 MED ORDER — ALBUTEROL SULFATE HFA 108 (90 BASE) MCG/ACT IN AERS
108 (90 Base) MCG/ACT | RESPIRATORY_TRACT | 3 refills | Status: AC | PRN
Start: 2017-12-21 — End: ?

## 2017-12-21 MED ORDER — AZITHROMYCIN 500 MG PO TABS
500 MG | PACK | Freq: Every day | ORAL | 0 refills | Status: AC
Start: 2017-12-21 — End: 2017-12-26

## 2017-12-21 MED FILL — ASPIRIN EC 81 MG PO TBEC: 81 MG | ORAL | Qty: 1

## 2017-12-21 MED FILL — SOLU-MEDROL 125 MG IJ SOLR: 125 MG | INTRAMUSCULAR | Qty: 125

## 2017-12-21 MED FILL — FLUZONE QUADRIVALENT 0.5 ML IM SUSY: 0.5 mL | INTRAMUSCULAR | Qty: 0.5

## 2017-12-21 MED FILL — IPRATROPIUM-ALBUTEROL 0.5-2.5 (3) MG/3ML IN SOLN: RESPIRATORY_TRACT | Qty: 3

## 2017-12-21 MED FILL — LOVENOX 40 MG/0.4ML SC SOLN: 40 MG/0.4ML | SUBCUTANEOUS | Qty: 0.4

## 2017-12-21 MED FILL — PANTOPRAZOLE SODIUM 40 MG PO TBEC: 40 MG | ORAL | Qty: 1

## 2017-12-21 NOTE — Discharge Instructions (Signed)
???   Good nutrition is important when healing from an illness, injury, or surgery.  Follow any nutrition recommendations given to you during your hospital stay.   ??? If you were given an oral nutrition supplement while in the hospital, continue to take this supplement at home.  You can take it with meals, in-between meals, and/or before bedtime. These supplements can be purchased at most local grocery stores, pharmacies, and chain super-stores.   ??? If you have any questions about your diet or nutrition, call the hospital and ask for the dietitian.  ??? As Tolerated

## 2017-12-21 NOTE — Progress Notes (Signed)
Pt wife arrived, pt ambulated with belongings to car, pt stable at time of discharge

## 2017-12-21 NOTE — Care Coordination-Inpatient (Signed)
ONGOING DISCHARGE PLAN:    Spoke with patient regarding discharge plan and patient confirms that plan is still home with no needs.    Discharge planned for today.     Will continue to follow for additional discharge needs.    Electronically signed by Louanna RawKathy S Fowler Antos, RN on 12/21/2017 at 11:02 AM

## 2017-12-21 NOTE — Progress Notes (Signed)
RX called to [facility] in [place], discharge instructions given, verbalized understaning, pt confirmed belongings pt signed discharge, pt waiting for wife to arrive

## 2017-12-21 NOTE — Plan of Care (Signed)
Problem: Falls - Risk of:  Goal: Will remain free from falls  Description  Will remain free from falls  Outcome: Ongoing  Note:   Pt. Free of falls and injuries this shift.      Problem: Breathing Pattern - Ineffective:  Goal: Ability to achieve and maintain a regular respiratory rate will improve  Description  Ability to achieve and maintain a regular respiratory rate will improve  Outcome: Ongoing  Note:   Maintained adequate oxygen saturations. Encouraged C and DB and incentive spirometry. Oxygen therapy as needed. See head to toe assessment.

## 2017-12-21 NOTE — Discharge Summary (Signed)
Physician Discharge Summary     Patient ID:  Nicholas Zuniga  [id number]  [id number]  61 y.o.  1956/04/12    Admit date: 12/19/2017    Discharge date and time:      Admitting Physician: Dorinda HillMark C Darcel Zick, DO     Discharge Physician: Dorinda HillMark C Tyshika Baldridge, DO      Admission Diagnoses:   Pneumonia [J18.9]  Pneumonia [J18.9]    Discharge Diagnoses:   Patient Active Problem List   Diagnosis Code   ??? PAD (peripheral artery disease) (HCC) I73.9   ??? Pneumonia J18.9     Active Problems:    Pneumonia  Resolved Problems:    * No resolved hospital problems. *      Admission Condition: poor    Discharged Condition: good    Hospital Course: pulm toilet    Significant Diagnostic Studies: microbiology:     Treatments: antibiotics: ceftriaxone and azithromycin    Disposition: home    Patient Instructions:     Discharge Medications:  Current Discharge Medication List           Details   rosuvastatin (CRESTOR) 5 MG tablet Take 5 mg by mouth every other day      losartan-hydrochlorothiazide (HYZAAR) 100-12.5 MG per tablet Take 1 tablet by mouth daily      carvedilol (COREG) 25 MG tablet Take 25 mg by mouth 2 times daily (with meals)      Multiple Vitamins-Minerals (THERAPEUTIC MULTIVITAMIN-MINERALS) tablet Take 1 tablet by mouth daily      levofloxacin (LEVAQUIN) 500 MG tablet Take 500 mg by mouth daily Starting 12/14/17 for 10 days      albuterol sulfate HFA 108 (90 Base) MCG/ACT inhaler Inhale 2 puffs into the lungs every 4 hours as needed for Wheezing       aspirin 81 MG EC tablet Take 81 mg by mouth daily.      omeprazole (PRILOSEC) 40 MG delayed release capsule Take 40 mg by mouth daily              Activity: activity as tolerated    Diet: regular diet    Follow-up with Veverly FellsMark C Symphonie Schneiderman, DO in 1 to 2 weeks, patient to call  for an appointment and if has any problems.      Electronically signed by Dorinda HillMark C Edrie Ehrich, DO  on 12/21/2017 at 9:48 AM

## 2017-12-25 LAB — CULTURE BLOOD #1
Culture: NO GROWTH
Culture: NO GROWTH

## 2020-07-18 IMAGING — CT CT LOW DOSE LUNG SCREENING
2 of 5 series · 15 of 36 positions shown, 18 images · non-contrast
Comparison: None.

HISTORY/INDICATION:  Current tobacco smoker.  Smoking history of about a quarter pack of cigarettes per day 30+ years.
TECHNIQUE: CT scan of the chest performed in the transaxial plane at 2 mm increments without IV contrast using a low-dose scanning protocol. .Total radiation dose to patient is CTDIvol 0.95 mGy and DLP 40 mGy-cm. 

[employer] nodule finding program was used to assist pulmonary nodule detection.

[Series 4: lung · axial · 0.83mm/px · z∈[-1231,-955]mm · 12 of 156 slices shown, 15 images]
[im 9/156  mediastinal]
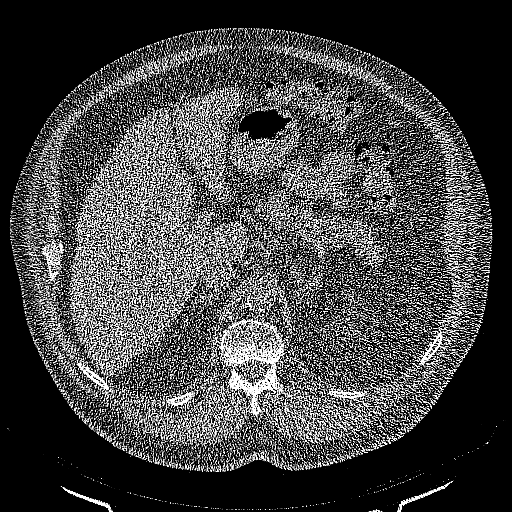
[im 9/156  lung]
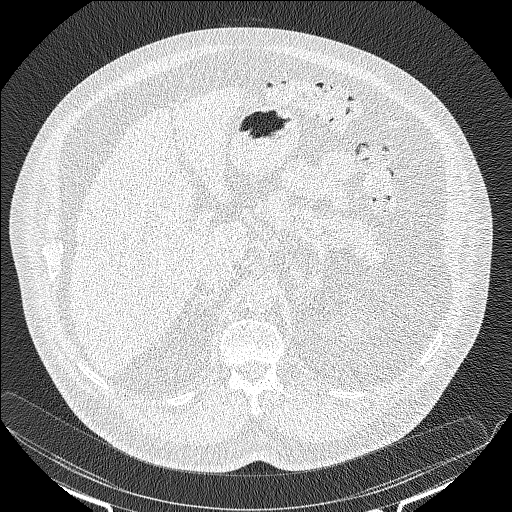
[im 26/156  lung]
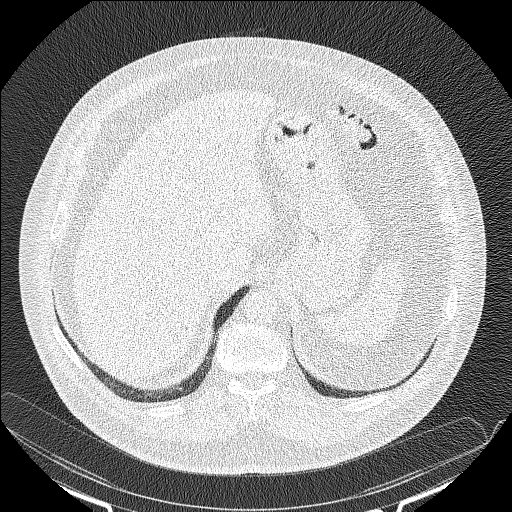
[im 35/156  lung]
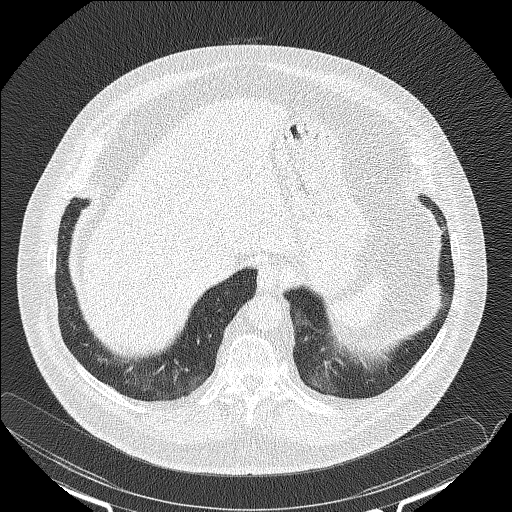
[im 44/156  lung]
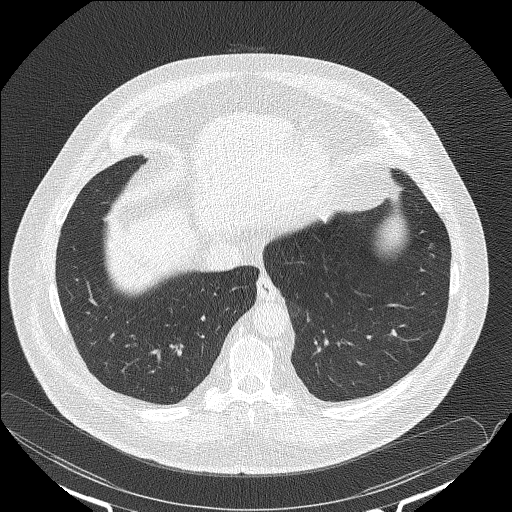
[im 61/156  mediastinal]
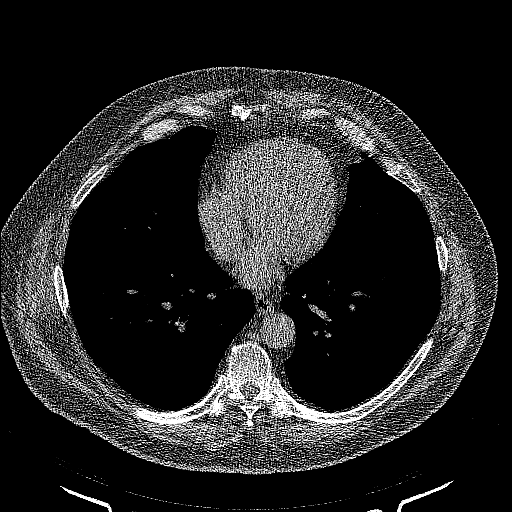
[im 61/156  lung]
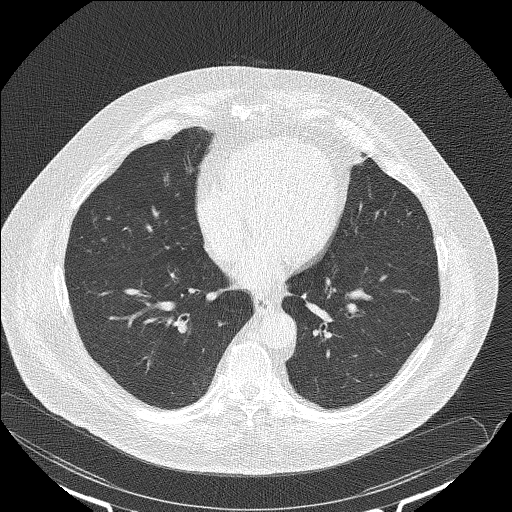
[im 69/156  lung]
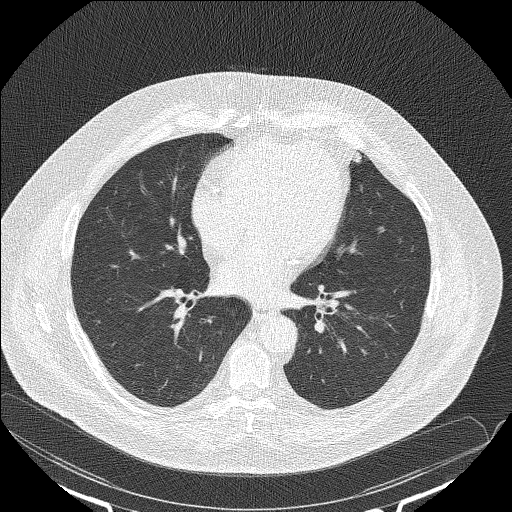
[im 87/156  lung]
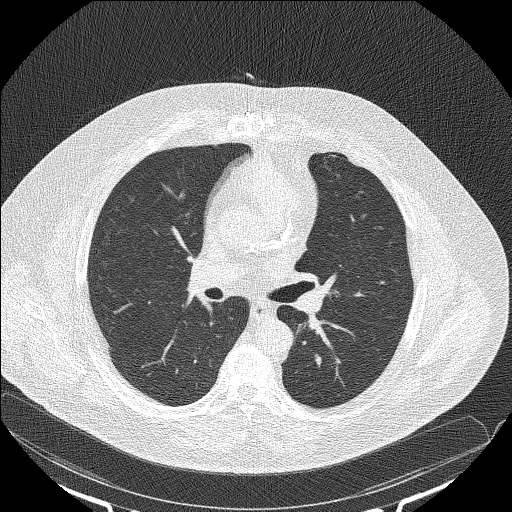
[im 95/156  lung]
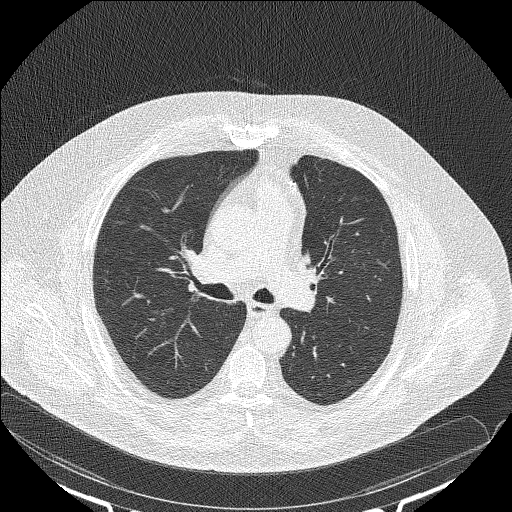
[im 112/156  mediastinal]
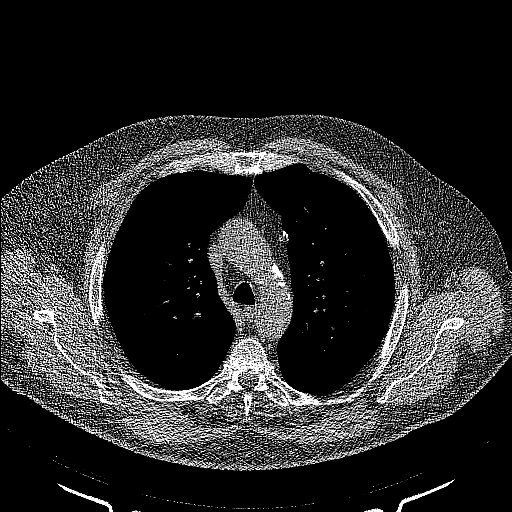
[im 112/156  lung]
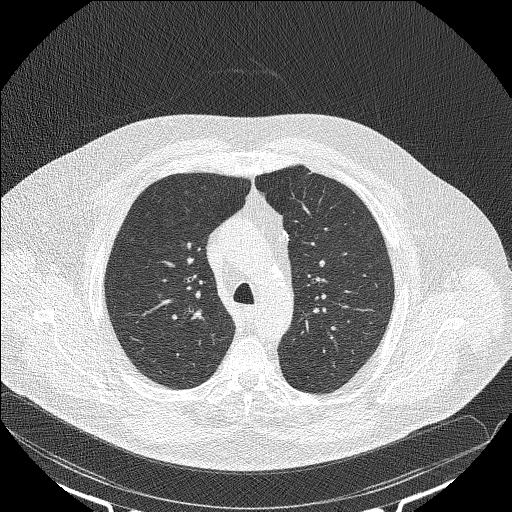
[im 121/156  lung]
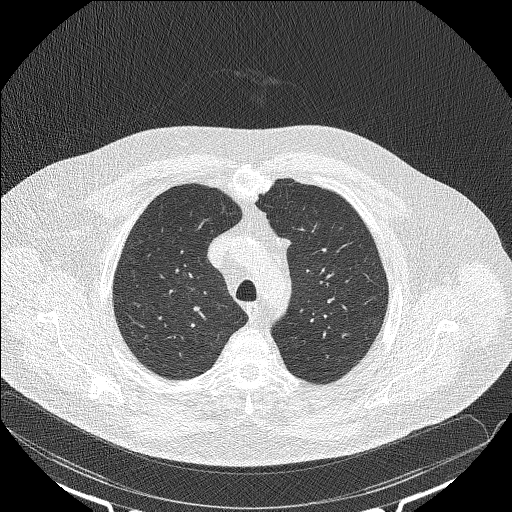
[im 130/156  lung]
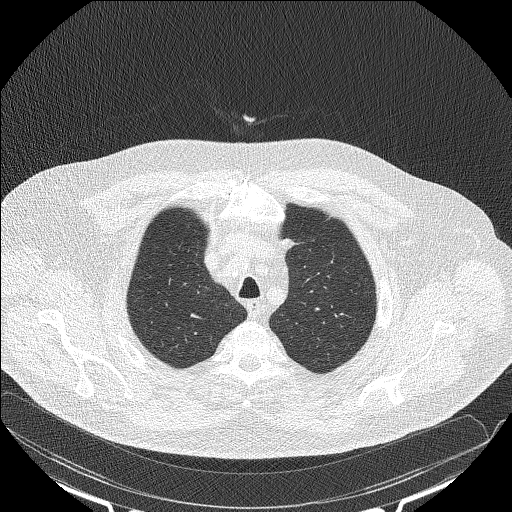
[im 147/156  lung]
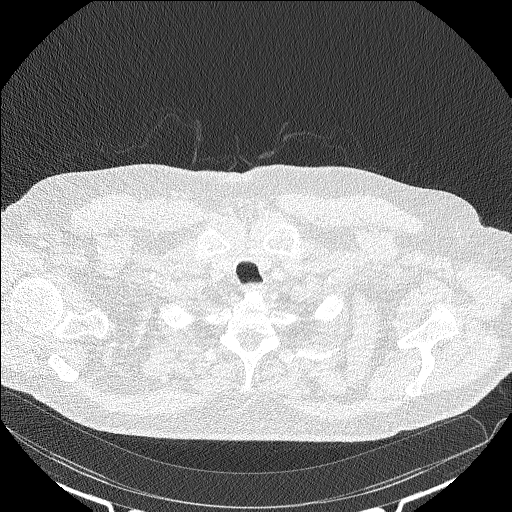

[Series 9: coronal · coronal · 0.75mm/px · 3 of 154 slices shown]
[im 31/154  lung]
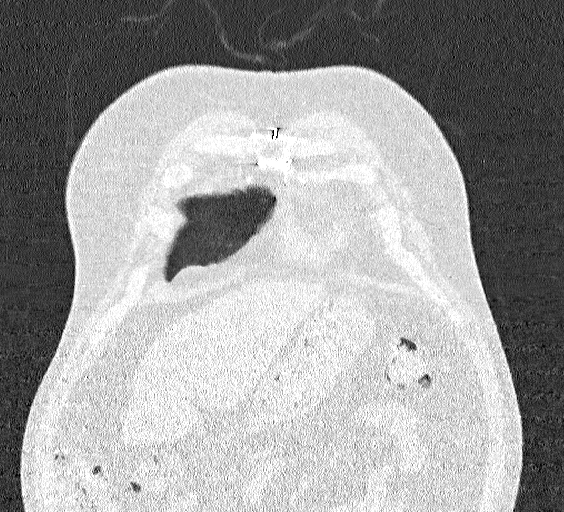
[im 62/154  lung]
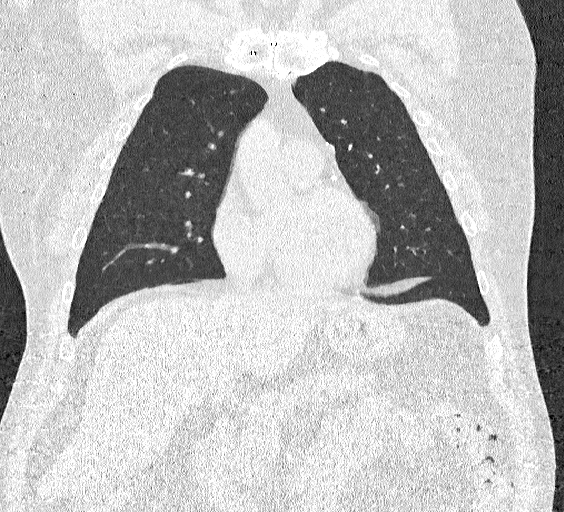
[im 92/154  lung]
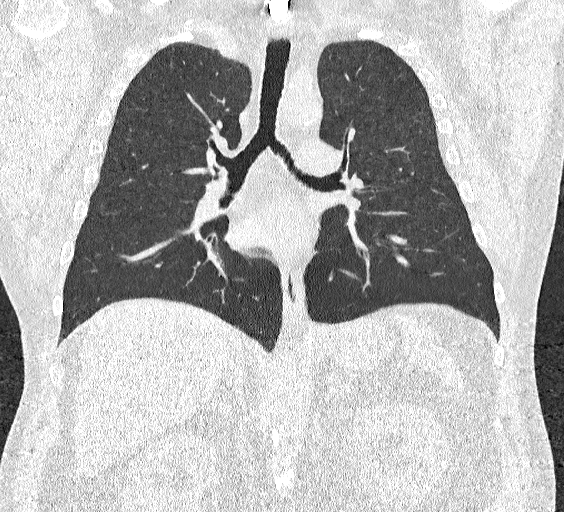

[15 of 36 positions shown; findings below may reference images not displayed]

FINDINGS: A single nodule is identified in the right upper lobe on image 42 series S4 of 3 mm. Mild emphysema pattern. No worrisome nodules. No pneumonia, no pleural calcification.

Thoracic inlet is normal. Large airway is patent. Aorta is atherosclerotic. Extensive coronary artery calcified atherosclerosis is present. Patient has had median sternotomy.

Thoracic esophagus not dilated.

Adrenal glands not enlarged. Visualized osseous structures show nothing destructive or acute. Nothing worrisome.
IMPRESSION: 1.  Lung Rads Category 2 S.

2.  Mild emphysema. Atherosclerosis, median sternotomy. Probability of malignancy less than 1%.

RECOMMENDATION:  Continue yearly low-dose CT of chest in 12 months.

[URL]

## 2020-10-02 IMAGING — US US AORTA SCREENING (AAA)
1 series · 15 of 20 positions shown · non-contrast
Comparison: None

HISTORY: Screening for AAA
TECHNIQUE: Ultrasound abdominal aorta

[Series 1: us aorta screening (aaa) · 15 of 20 slices shown]
[im 1/20]
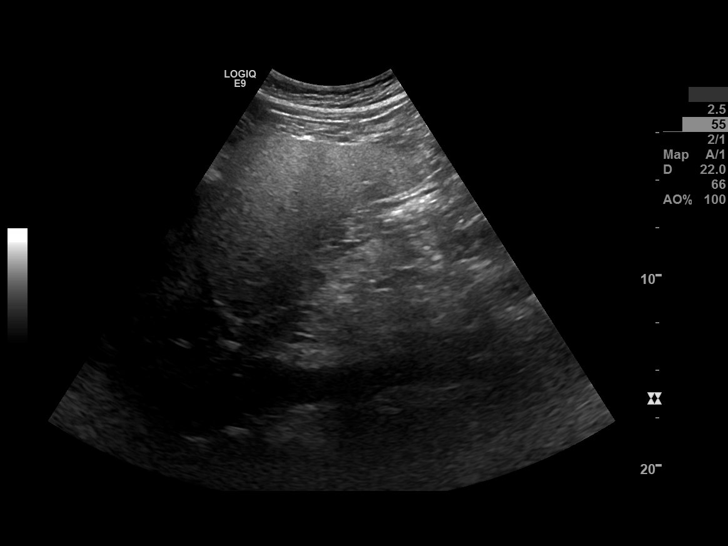
[im 3/20]
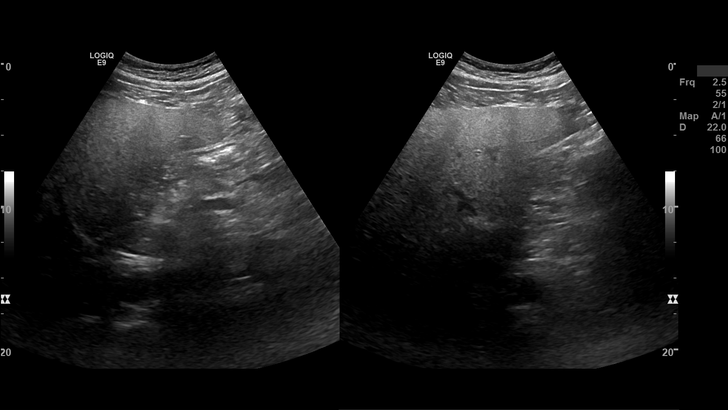
[im 4/20]
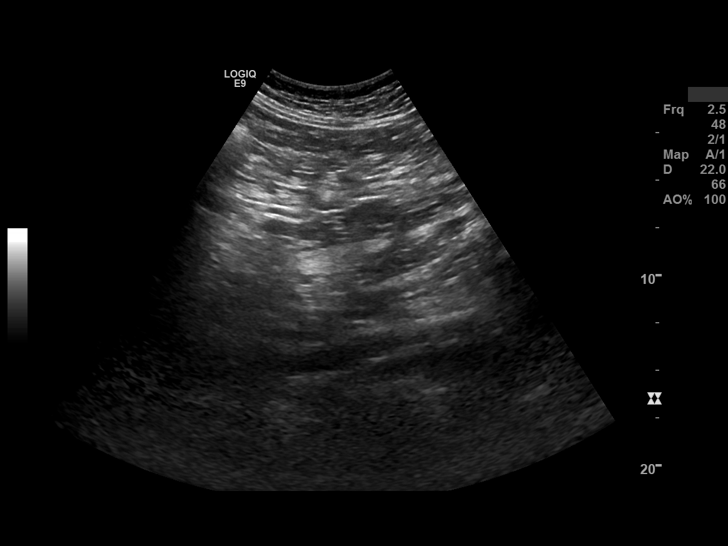
[im 5/20]
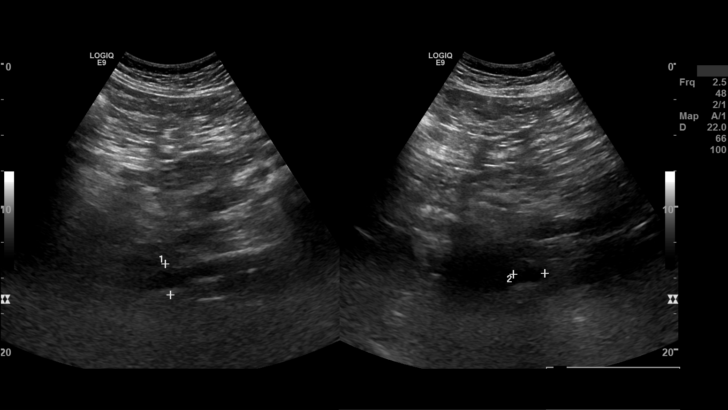
[im 7/20]
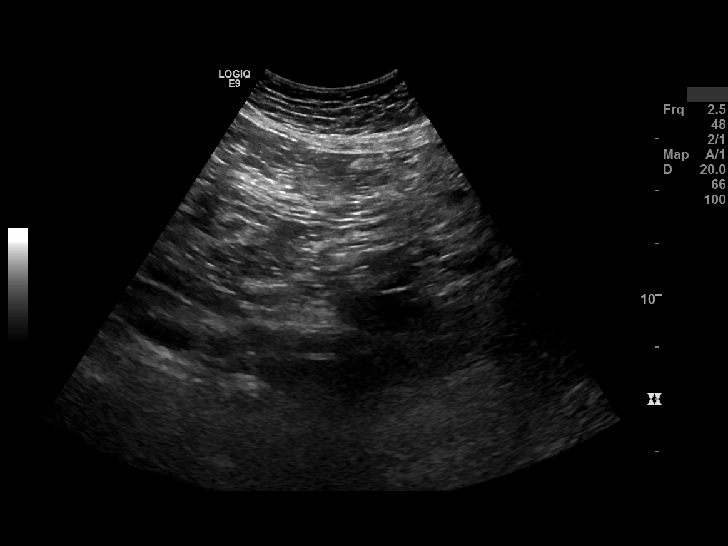
[im 8/20]
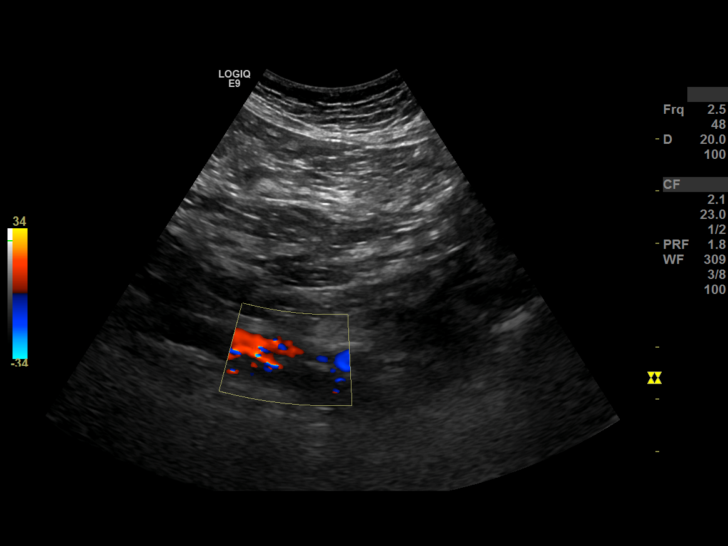
[im 9/20]
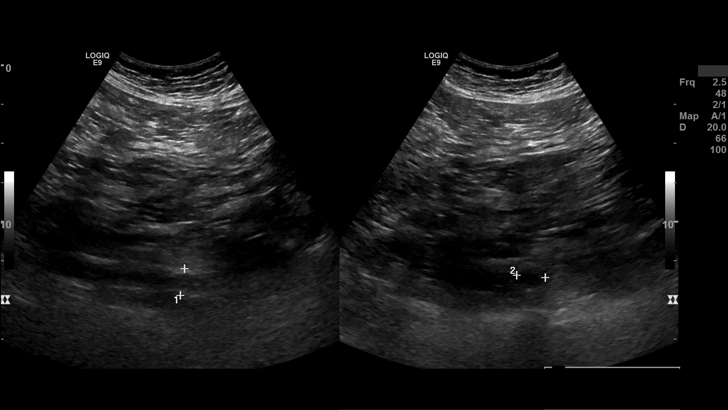
[im 11/20]
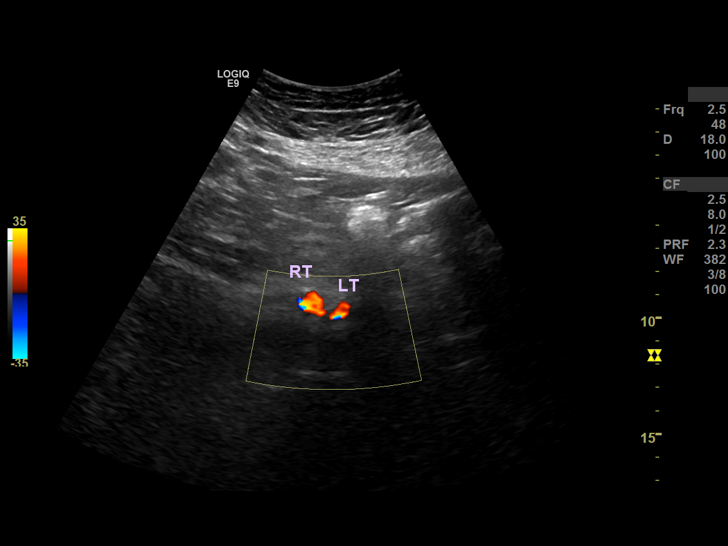
[im 12/20]
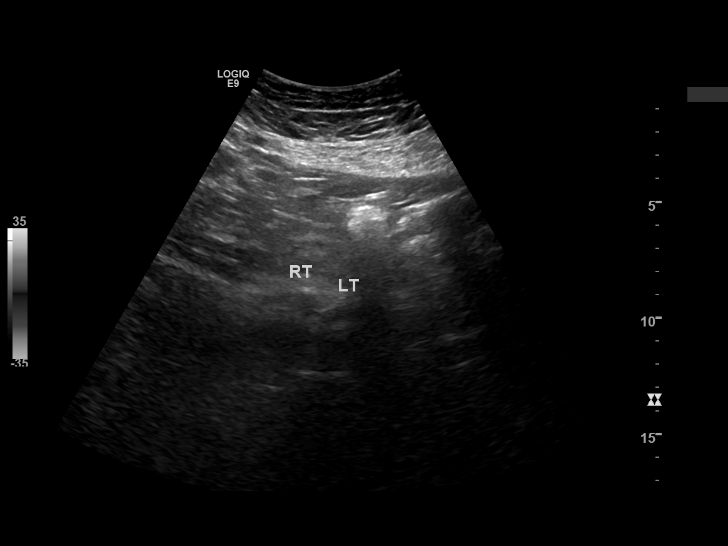
[im 13/20]
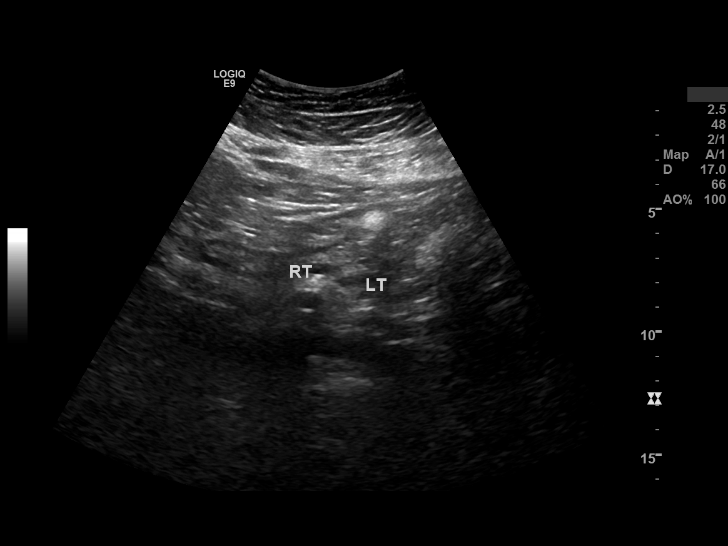
[im 15/20]
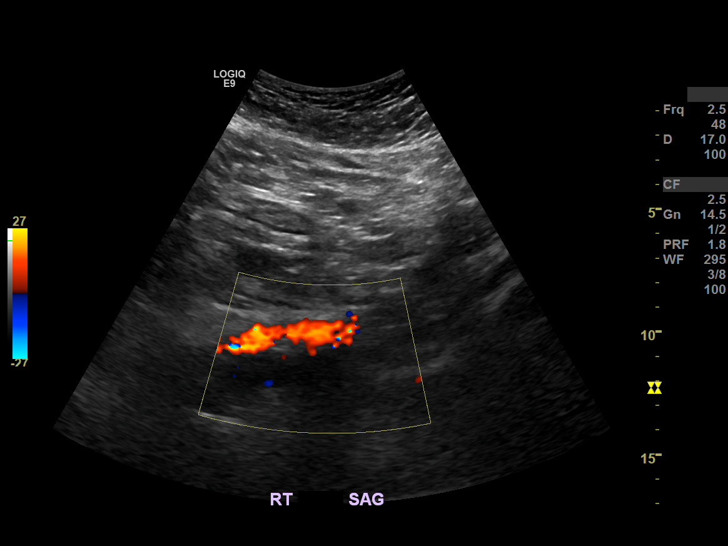
[im 16/20]
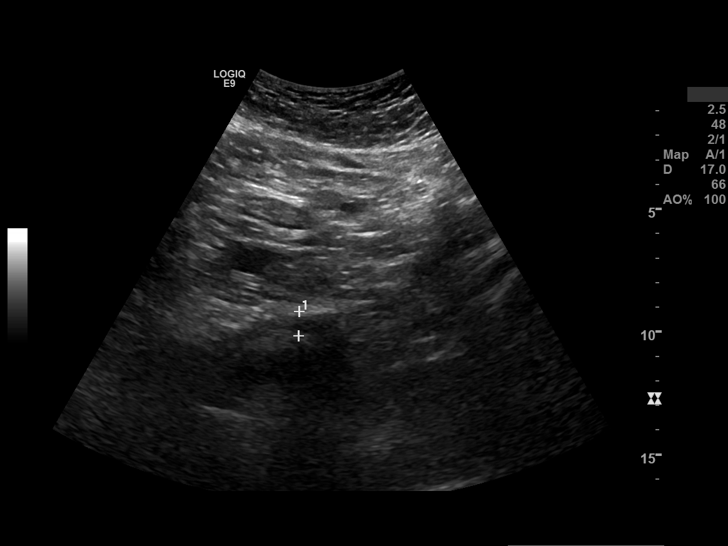
[im 17/20]
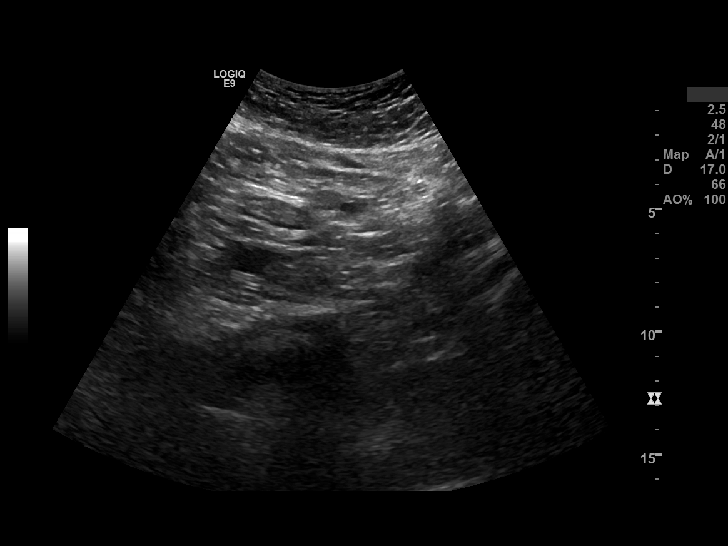
[im 19/20]
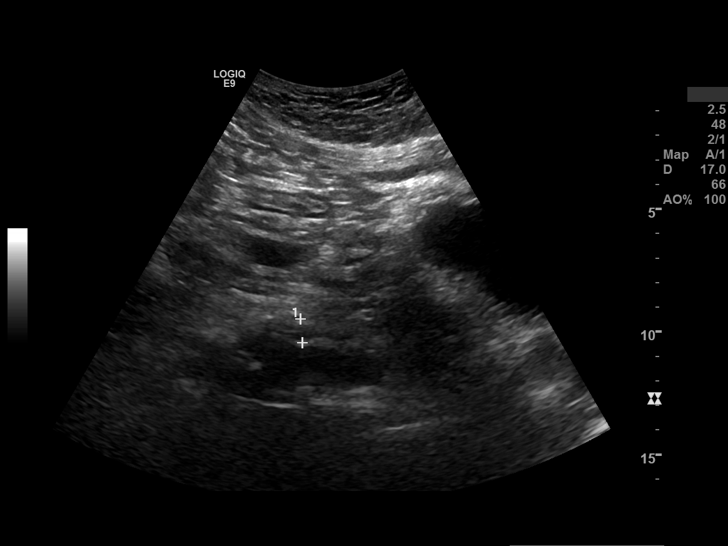
[im 20/20]
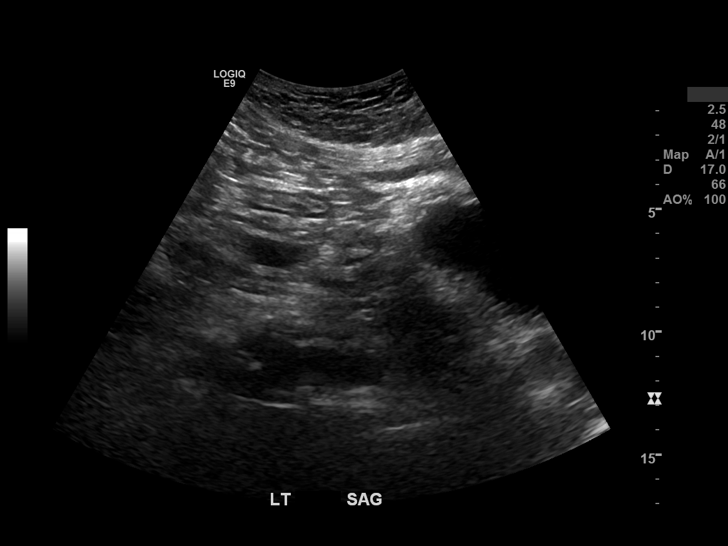

[15 of 20 positions shown; findings below may reference images not displayed]

FINDINGS: AORTA MEASUREMENTS

AORTA PROX:

Ant/Post  =  29 mm

Transverse = 26 mm

AORTA MID:

Ant/Post  =  22 mm

Transverse = 22 mm

AORTA DISTAL:

Ant/Post  =  17 mm

Transverse = 18 mm

RIGHT COMMON ILIAC ARTERY

Ant/Post  =  10 mm

Transverse = 11 mm

LEFT COMMON ILIAC ARTERY

Ant/Post  =  10 mm

Transverse = 12 mm
IMPRESSION: 1. There is calcified plaque within the aorta.

2. There is no abdominal aortic aneurysm.

## 2021-07-24 IMAGING — CT CT LOW DOSE LUNG SCREENING
2 of 5 series · 14 of 36 positions shown, 17 images · non-contrast
Comparison: Low-dose CT lung screening study 07/18/2020.

HISTORY: 65-year-old Male with one pack per day for 30 years, current tobacco use.
TECHNIQUE: Scans of the chest are performed in the transaxial plane, reconstructed at 2 mm increments, without IV contrast using a low-dose scanning protocol. Coronal and sagittal lung reconstructions are also obtained.            

Dose reduction technique used: Automated exposure control and adjustment of the mA and/or kV according to patient size. CT count in previous 12 months: 0 
Total radiation dose to patient is CTDIvol 0.97 mGy and DLP 41.70 mGy-cm.

[Series 4: lung · axial · 0.87mm/px · z∈[-1282,-986]mm · 11 of 168 slices shown, 14 images]
[im 10/168  mediastinal]
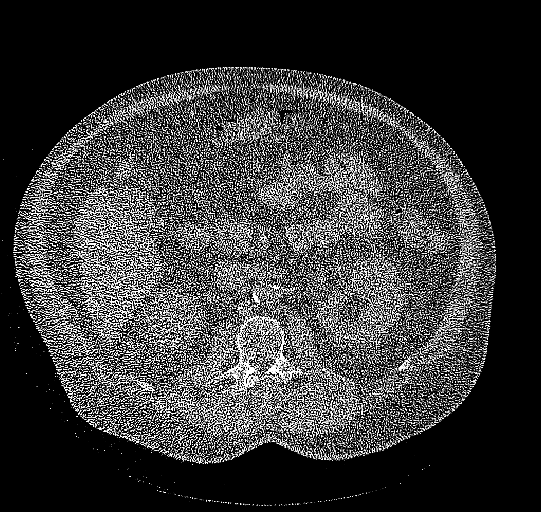
[im 10/168  lung]
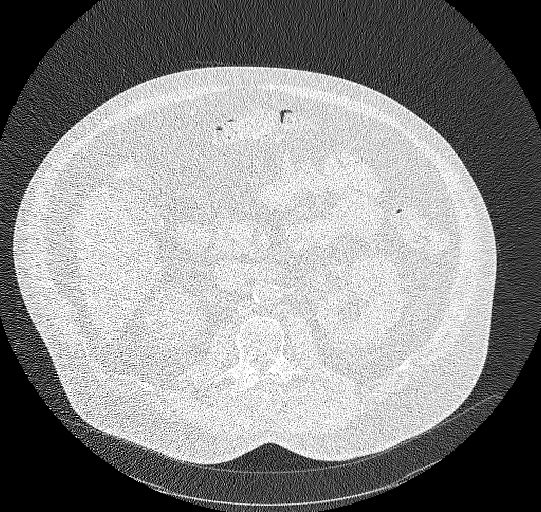
[im 28/168  lung]
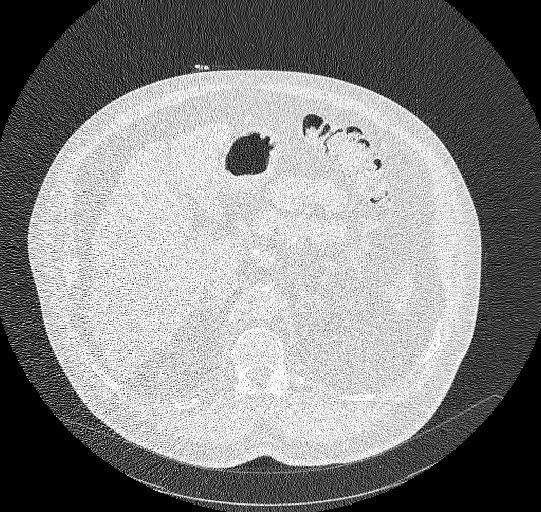
[im 38/168  lung]
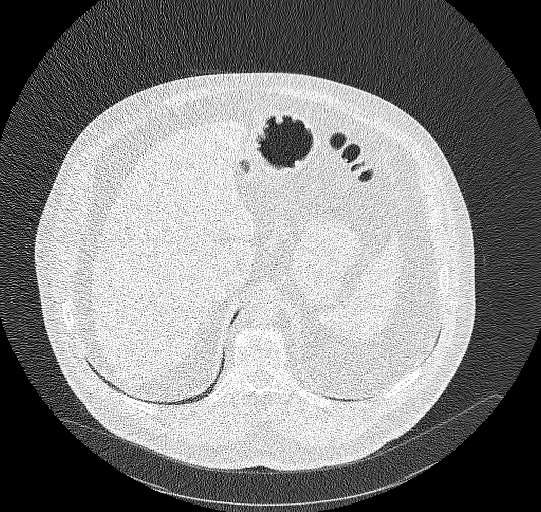
[im 56/168  lung]
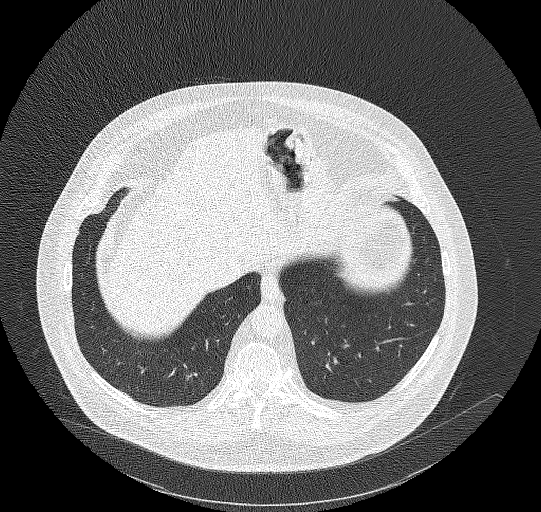
[im 65/168  mediastinal]
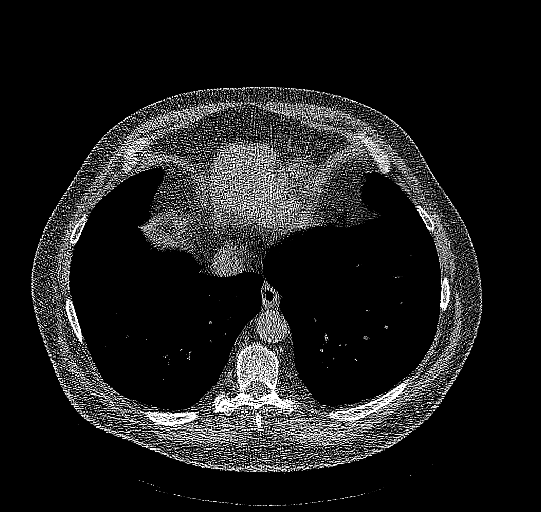
[im 65/168  lung]
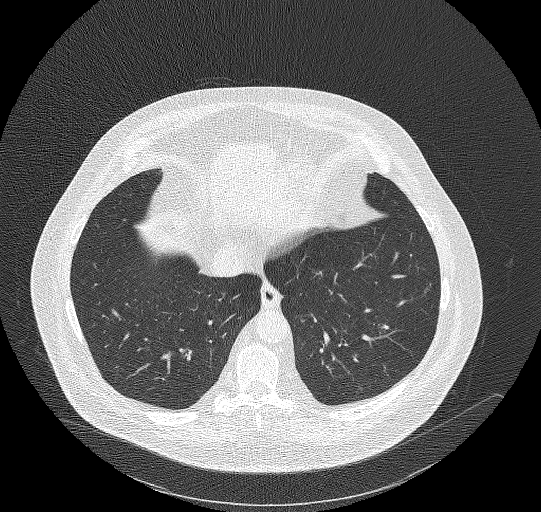
[im 84/168  lung]
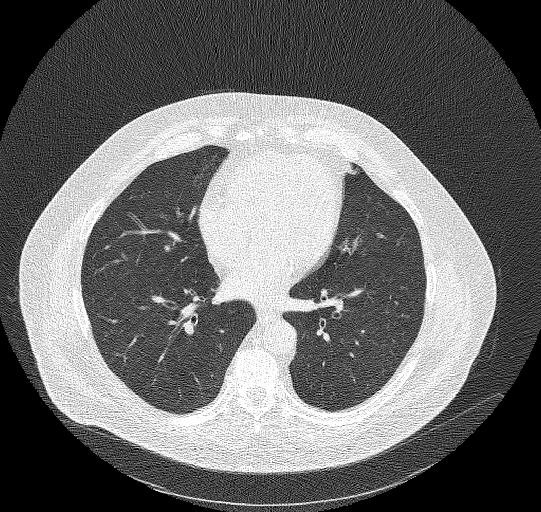
[im 103/168  lung]
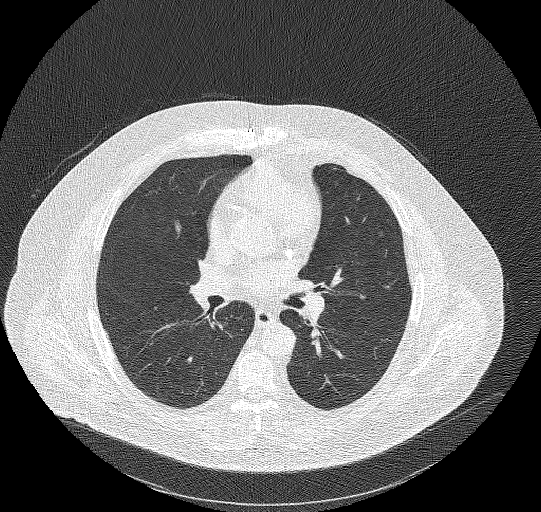
[im 112/168  lung]
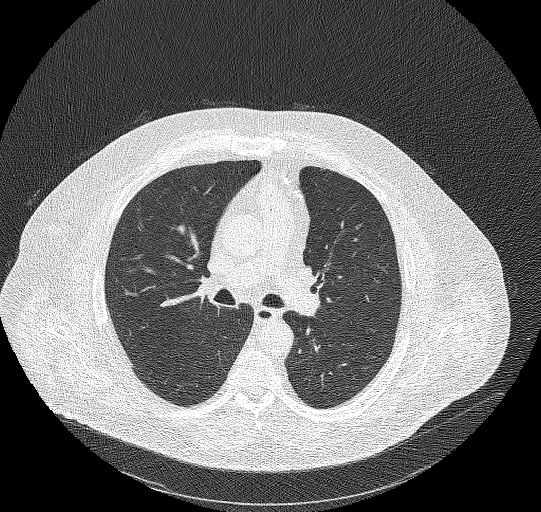
[im 130/168  mediastinal]
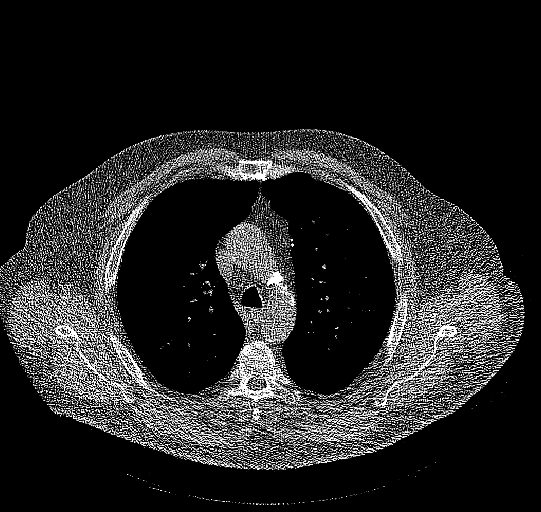
[im 130/168  lung]
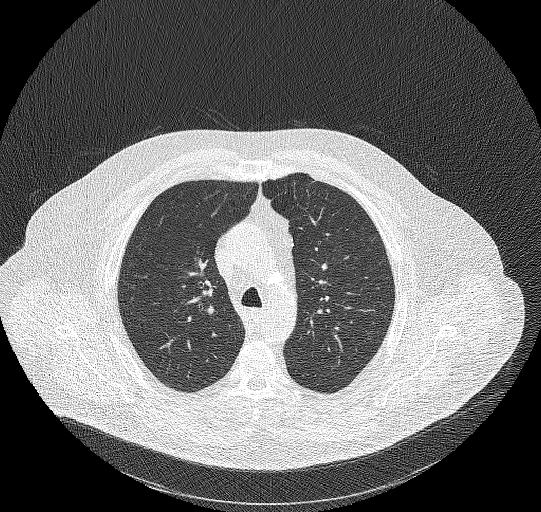
[im 140/168  lung]
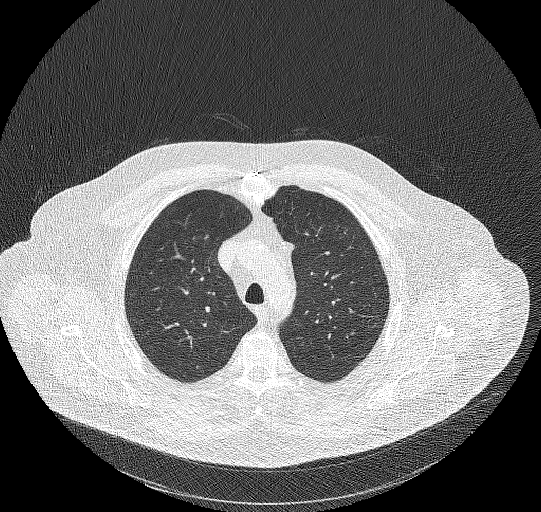
[im 158/168  lung]
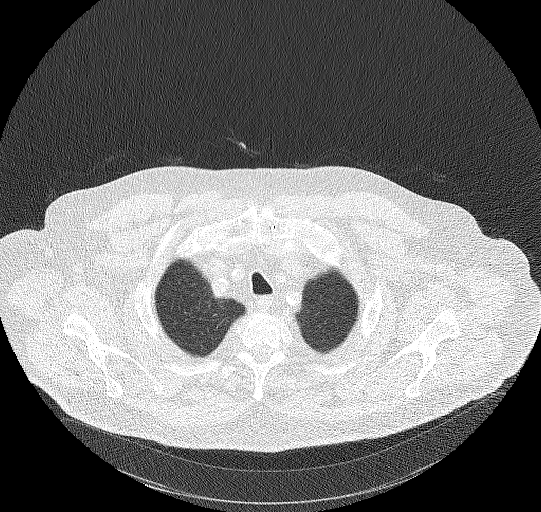

[Series 6: cor · coronal · 0.66mm/px · 3 of 126 slices shown]
[im 26/126  lung]
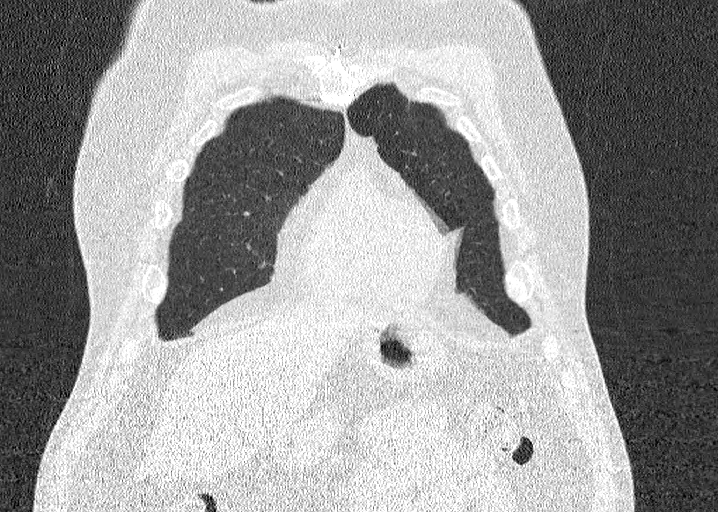
[im 51/126  lung]
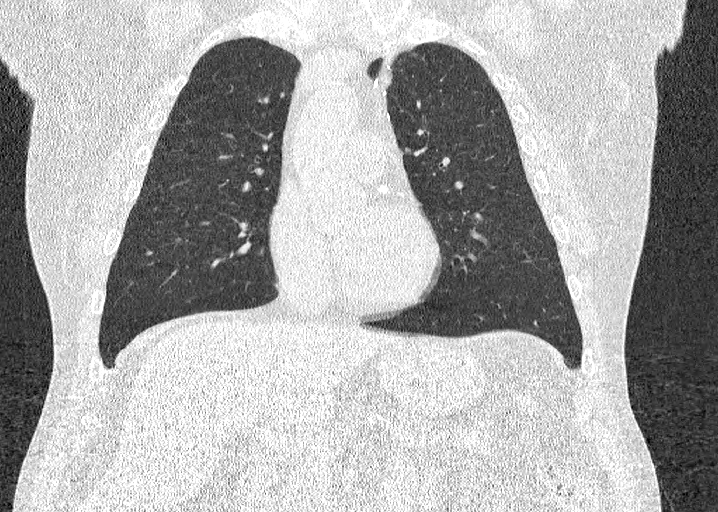
[im 76/126  lung]
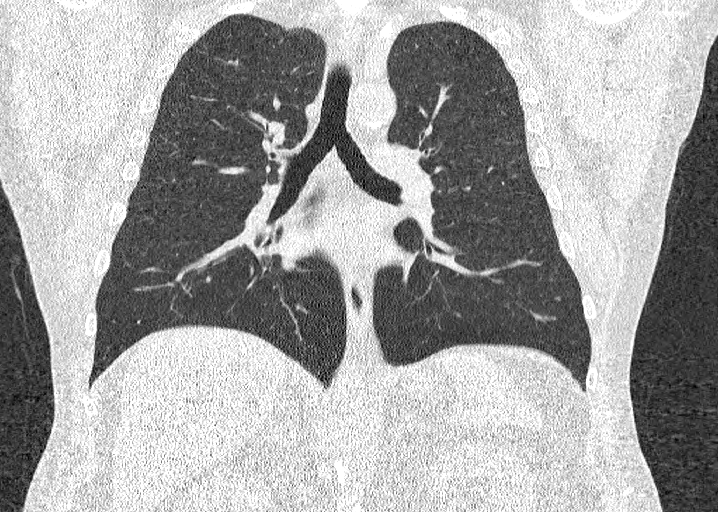

[14 of 36 positions shown; findings below may reference images not displayed]

FINDINGS: Lungs and airways: There is a new branching nodular density identified in the right upper lobe with a nodular component measuring 7.2 mm. A small 4 mm or less nodule is identified right lung ([DATE]). The remaining airways and the lungs are unremarkable.

Pleura: No pleural effusion.

Thoracic inlet: The thoracic inlet is unremarkable.

Mediastinum and Hilum: There is no significant mediastinal or hilar lymphadenopathy.

Heart and great vessels: Extensive coronary arterial calcifications are present. Status post median sternotomy and coronary artery bypass graft.

Soft tissues: Normal.

Abdomen: The visualized upper abdomen is within normal limits.

Bones: The visualized bony thorax is within normal limits.
IMPRESSION: New branching nodular density identified in the right upper lobe with a nodular component measuring 7.2 mm.

CATEGORY: 4A

Suspicious. The probability for malignancy is 5-15%.

Recommendation: Follow-up noncontrast enhanced CT chest study (low dose CT technique) in 3 months.

IMPORTANT FINDING!

## 2021-07-24 IMAGING — US US SOFT TISSUE ABDOMEN
1 series · 12 of 12 positions shown · non-contrast
Comparison: None.

REASON FOR EXAM: Pain.

[Series 1: us soft tissue abdomen · 12 of 12 slices shown]
[im 1/12]
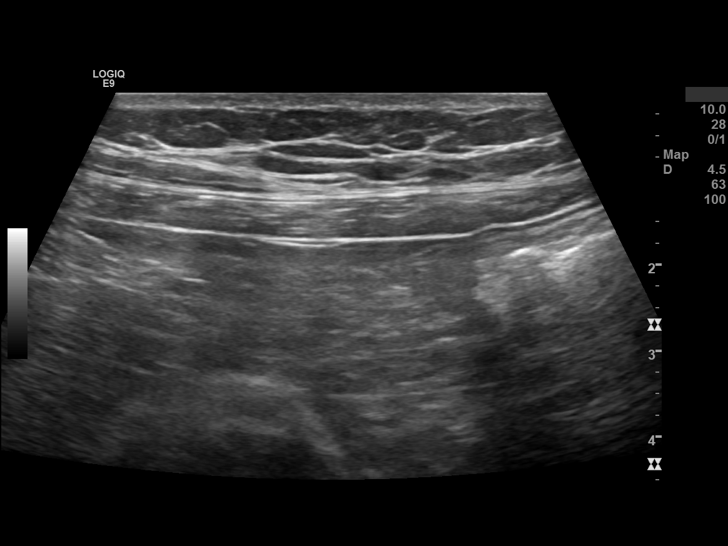
[im 2/12]
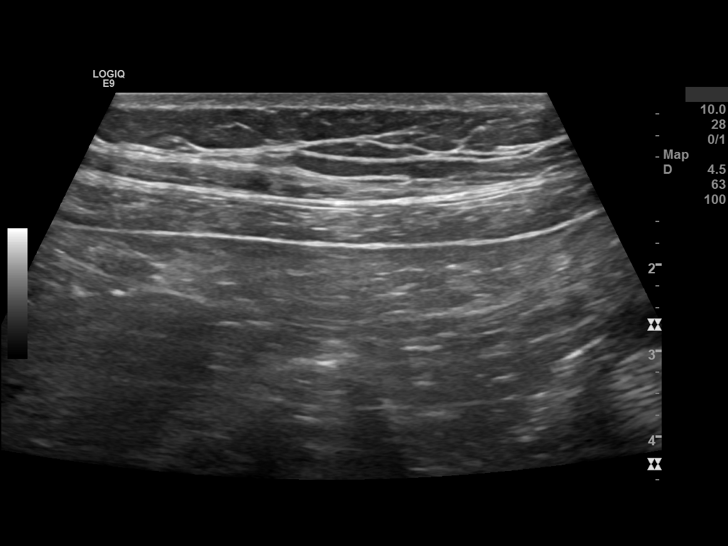
[im 3/12]
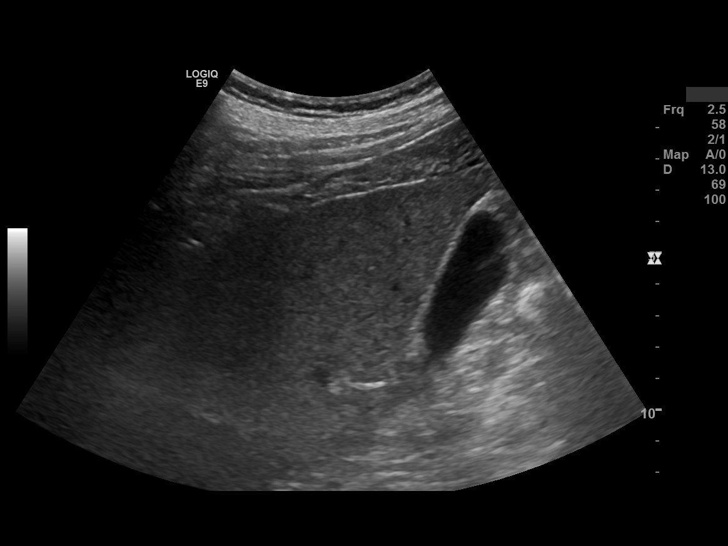
[im 4/12]
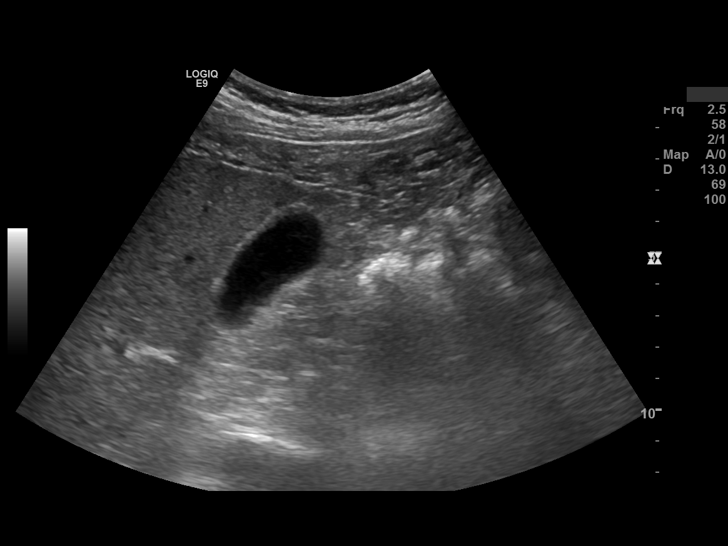
[im 5/12]
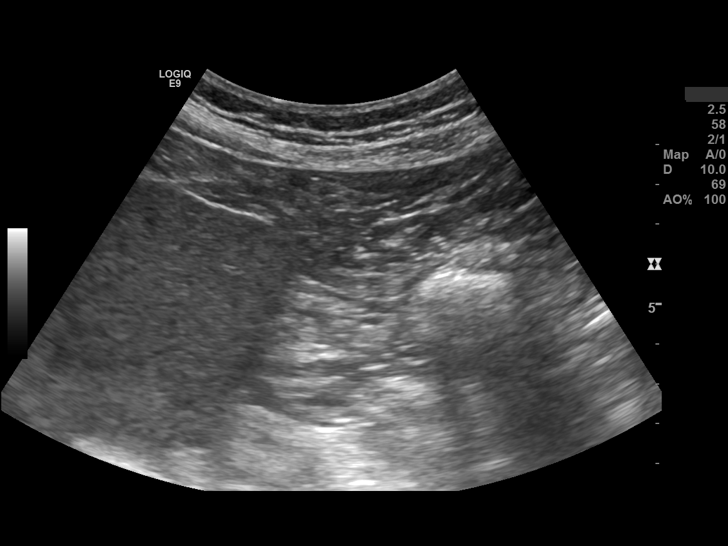
[im 6/12]
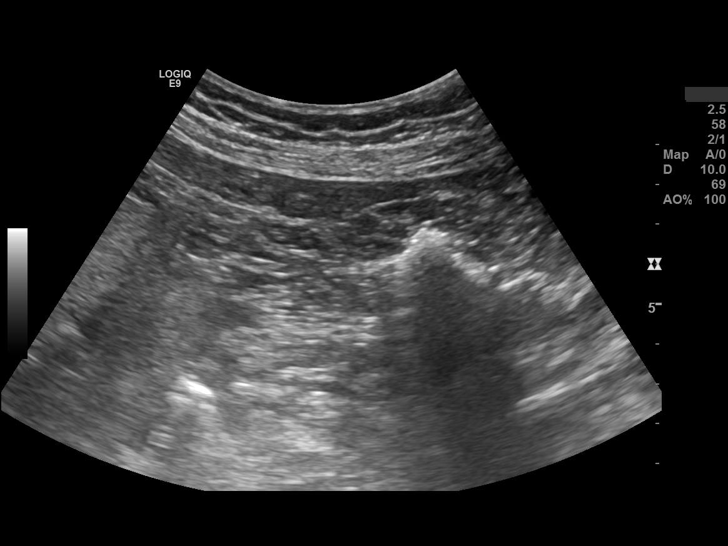
[im 7/12]
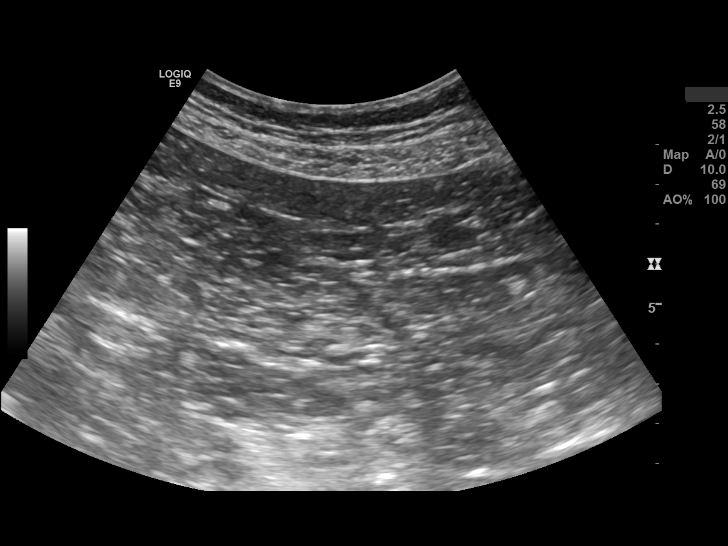
[im 8/12]
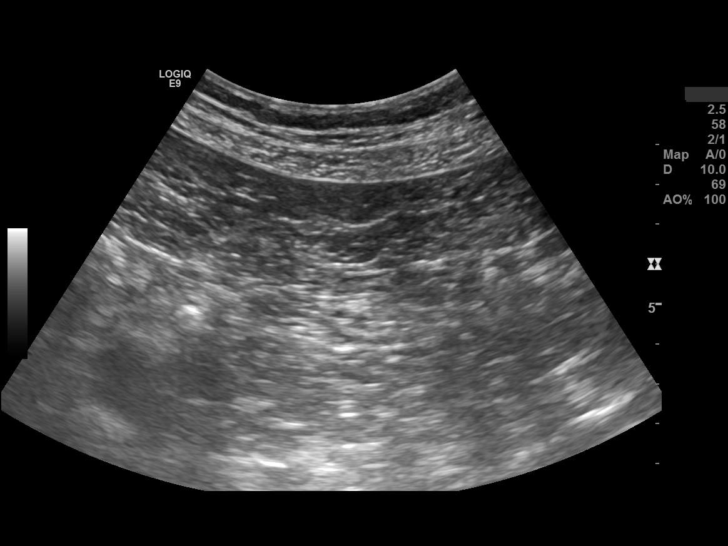
[im 9/12]
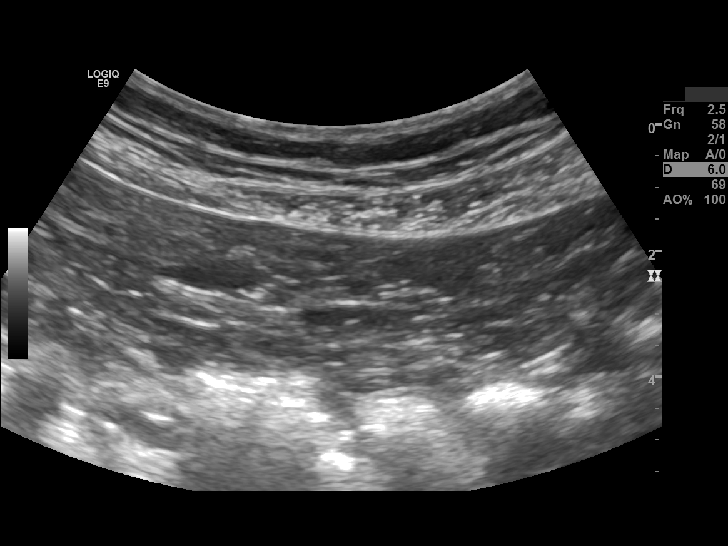
[im 10/12]
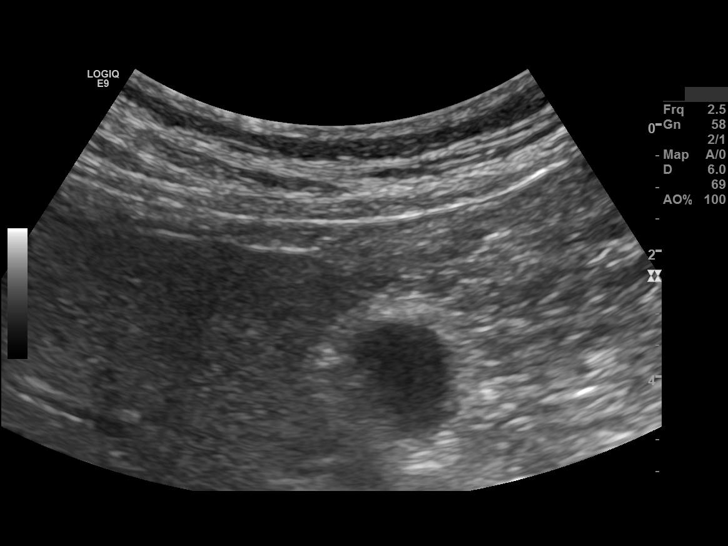
[im 11/12]
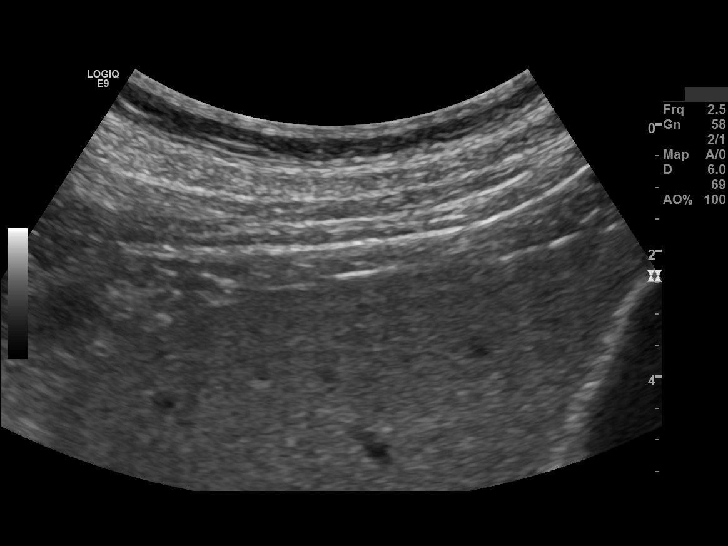
[im 12/12]
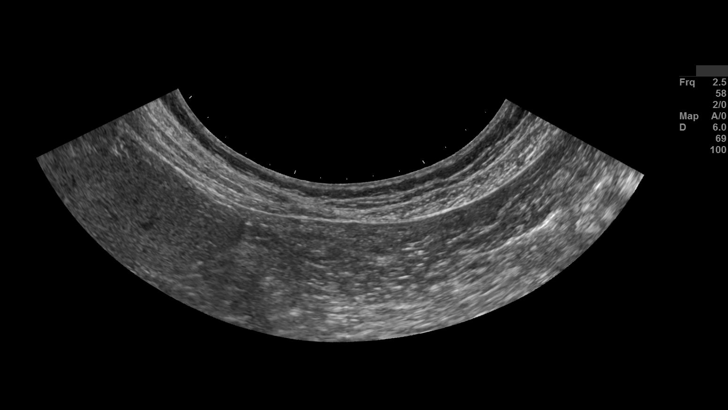

[12 of 12 positions shown; findings below may reference images not displayed]

TECHNIQUE AND FINDINGS: Multiple ultrasound images of the right upper quadrant abdominal wall were obtained over the area of clinical concern. The skin and subcutaneous fat layers have a normal echotexture and appearance. Underlying abdominal wall appears intact. No hernia is identified. There are no solid or cystic masses seen.

Visualized gallbladder and liver are unremarkable.
IMPRESSION: Negative exam.

## 2021-12-26 IMAGING — US US SCROTUM W/DOPPLER SCROTUM
1 series · 14 of 25 positions shown · non-contrast
Comparison: none

[Series 1: us scrotum w/doppler scrotum · 42 acquisitions, 14 frames shown]
[im 1/42]
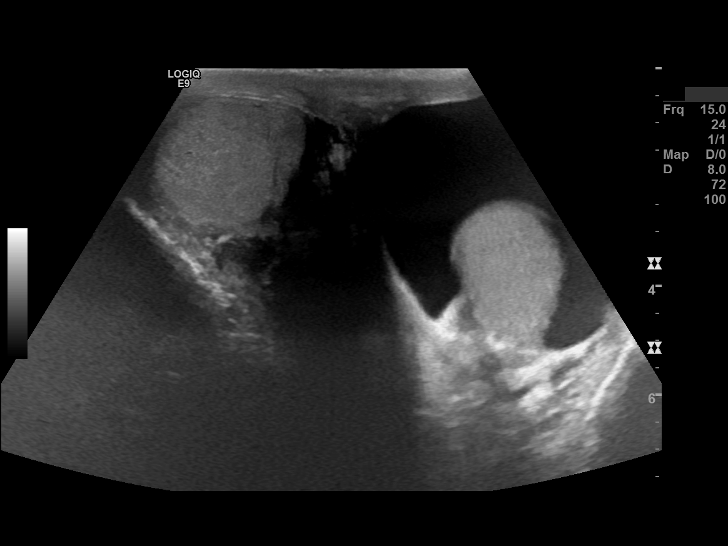
[im 4/42]
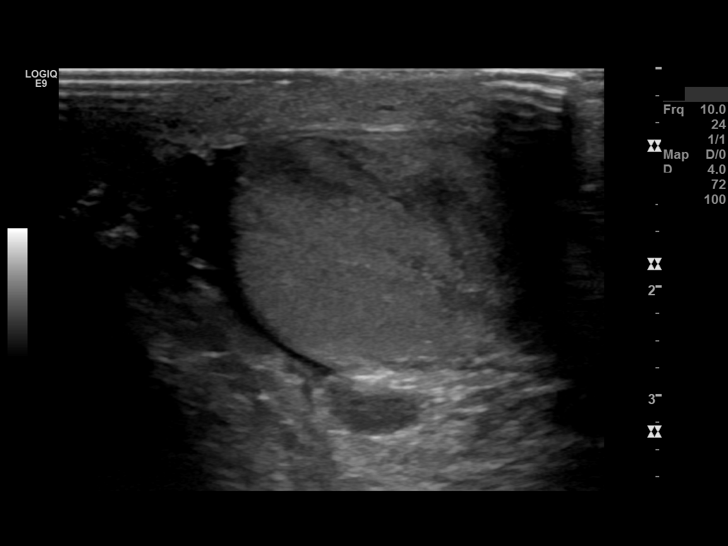
[im 7/42]
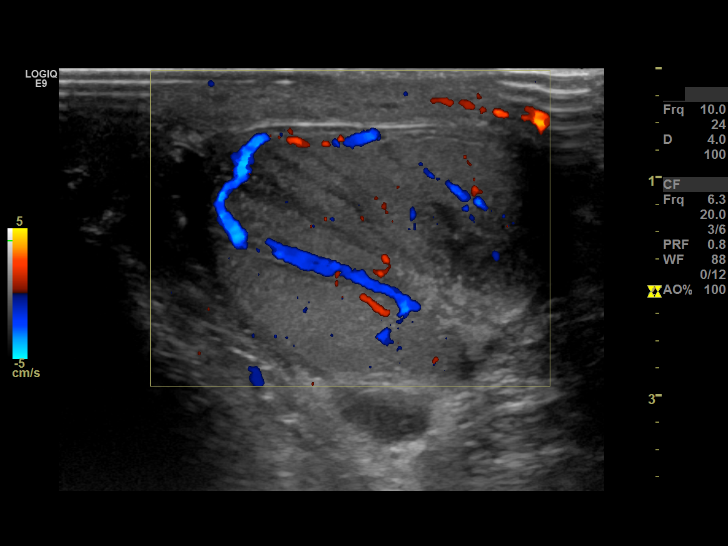
[im 11/42]
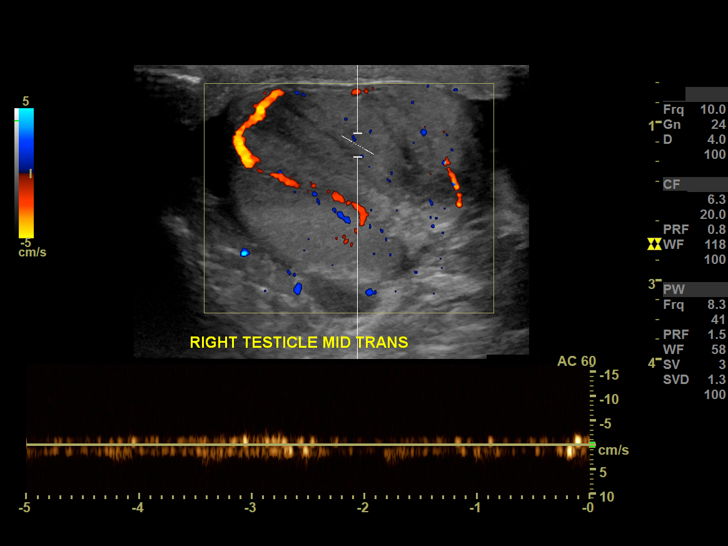
[im 14/42]
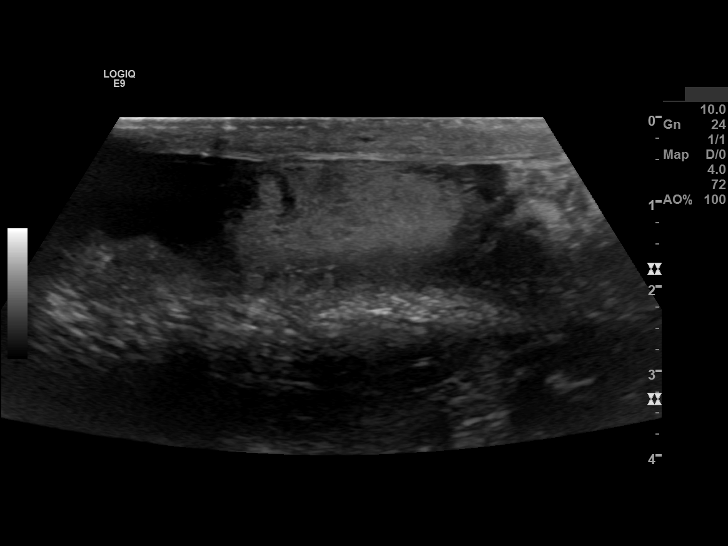
[im 16/42]
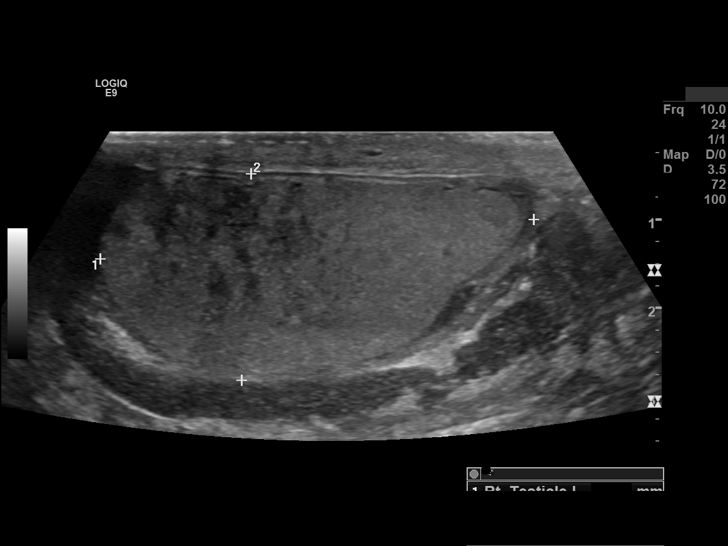
[im 19/42]
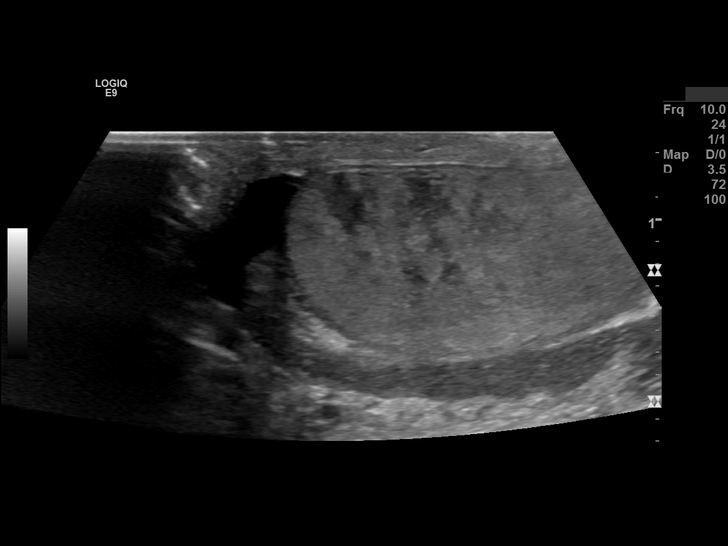
[im 23/42]
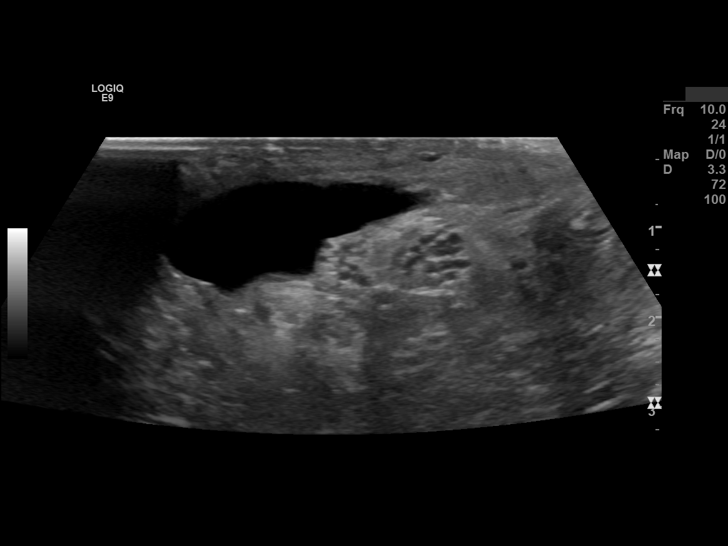
[im 26/42]
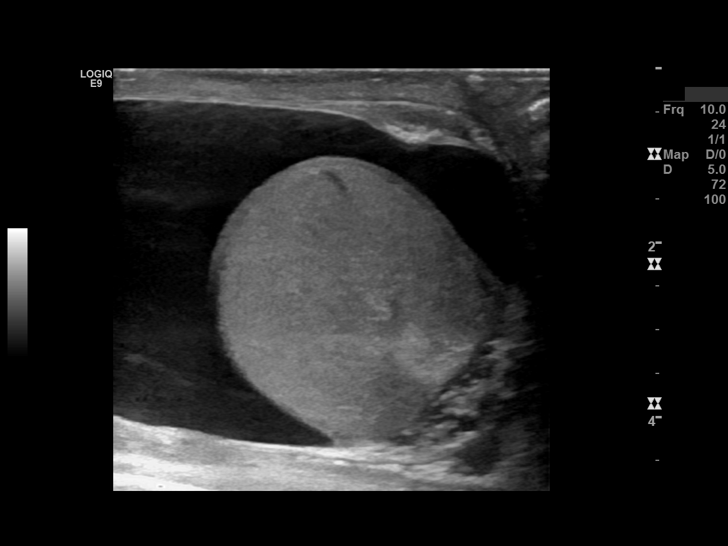
[im 28/42]
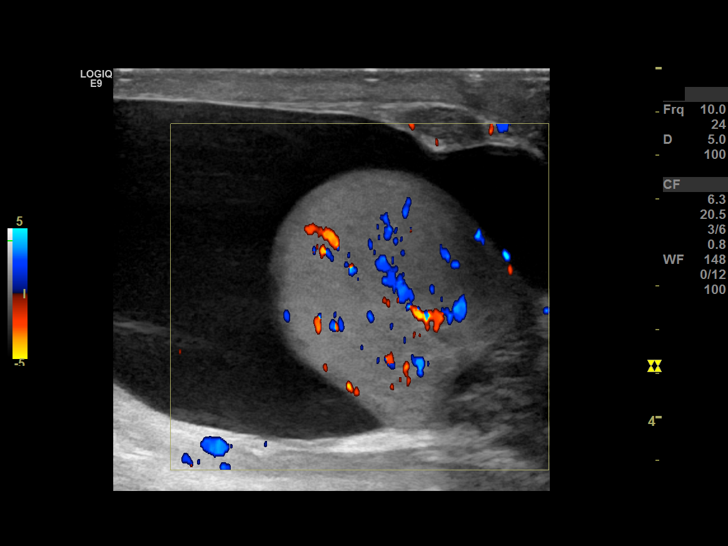
[im 31/42]
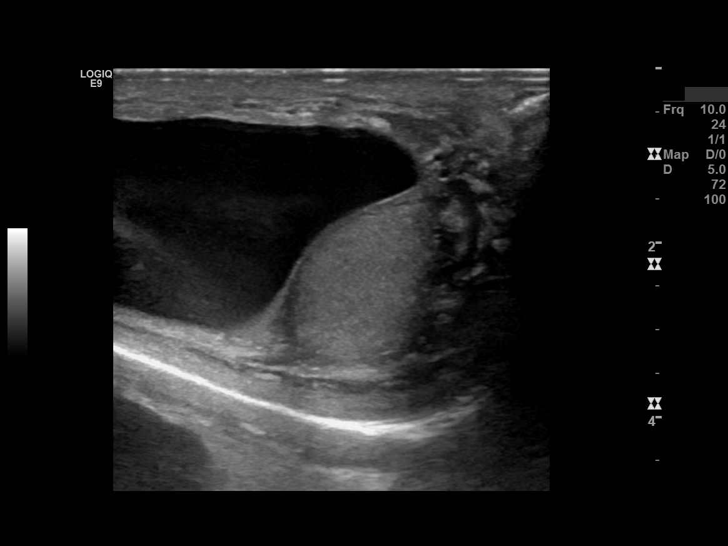
[im 35/42]
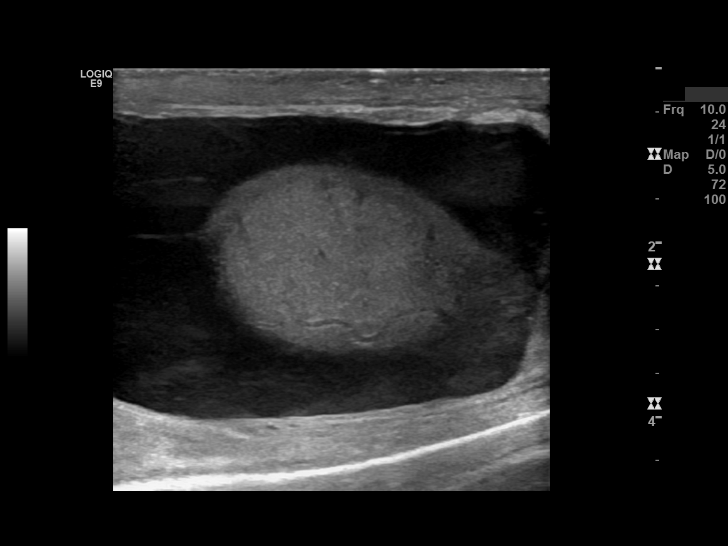
[im 38/42]
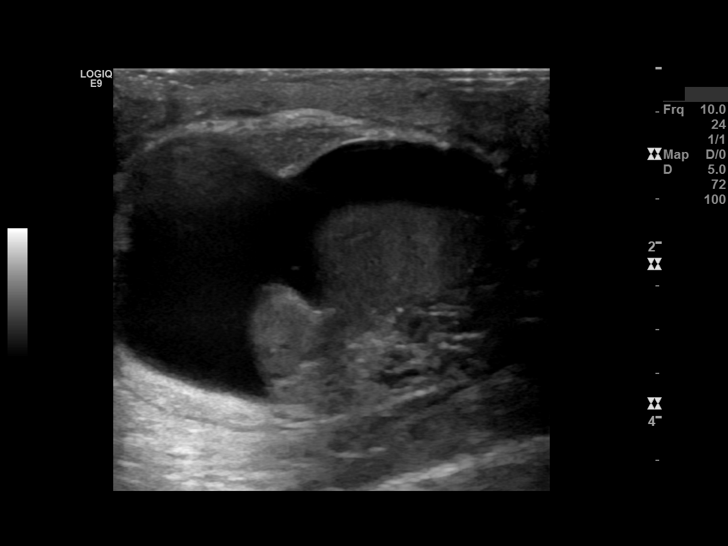
[im 42/42]
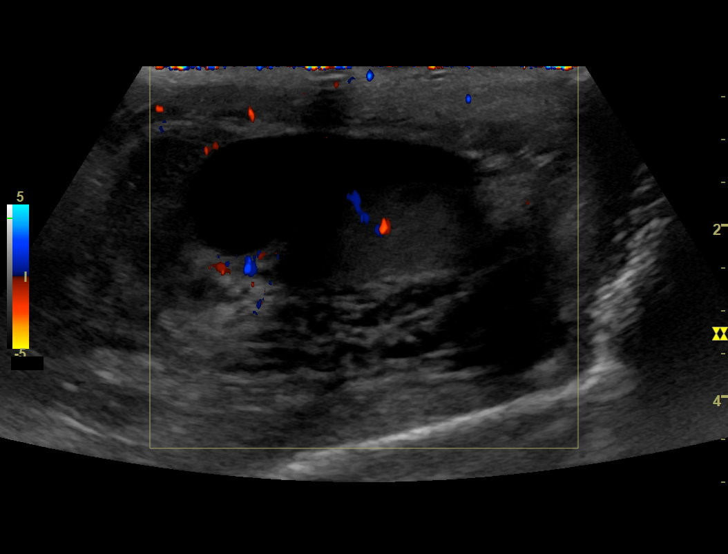

[14 of 25 positions shown; findings below may reference images not displayed]

REASON FOR EXAM

*
Swelling of left testicle 

COMPARISON

*
None

TECHNIQUE

*
Multiple gray-scale and color flow images of the scrotum were obtained with ultrasound

*
Spectral Doppler analysis was also performed

FINDINGS

Right Testicle

*
23.3 x 49.1 x 31.1 mm 

*
Peak systolic velocity 0.10 m/s

*
Normal arterial and venous flow

*
Heterogeneous without focal lesion

Right Extratesticular

*
Normal size and vascularity to the epididymis

*
No epididymal lesion

*
Small hydrocele

*
No varicocele

*
Unremarkable scrotal wall

Left Testicle

*
30.3 x 48.6 x 33.2 mm 

*
Peak systolic velocity 0.11 m/s

*
Normal arterial and venous flow

*
No focal lesion

Left Extratesticular

*
Normal size and vascularity to the epididymis

*
No epididymal lesion

*
Moderate hydrocele

*
No varicocele

*
Unremarkable scrotal wall

IMPRESSION

*
No evidence of torsion or epididymitis

*
Moderate left hydrocele

*
Heterogeneous right testicle (common and nonspecific) without discrete lesion

## 2022-02-12 IMAGING — MR MRI BRAIN W/WO CONTRAST
10 of 12 series · 31 of 48 positions shown · IV contrast (15cc prohance)
Comparison: None.

HISTORY: 65-year-old male with solitary pulmonary nodule.
TECHNIQUE: Multiplanar, multisequence MRI images of the brain are obtained prior to and following intravenous contrast.

CONTRAST: 15 mL of ProHance

[Series 5: flair_axial fs · axial · 4.0mm · 0.75mm/px · z∈[-61,+120]mm · 2 of 36 slices shown]
[im 1/36]
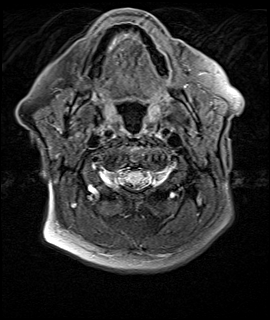
[im 36/36]
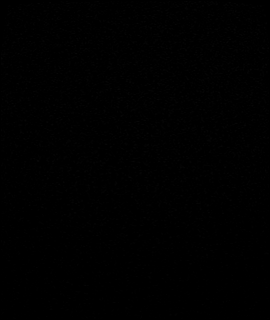

[Series 6: t2_axial · axial · 4.0mm · 0.38mm/px · 1 of 36 slices shown]
[im 1/36]
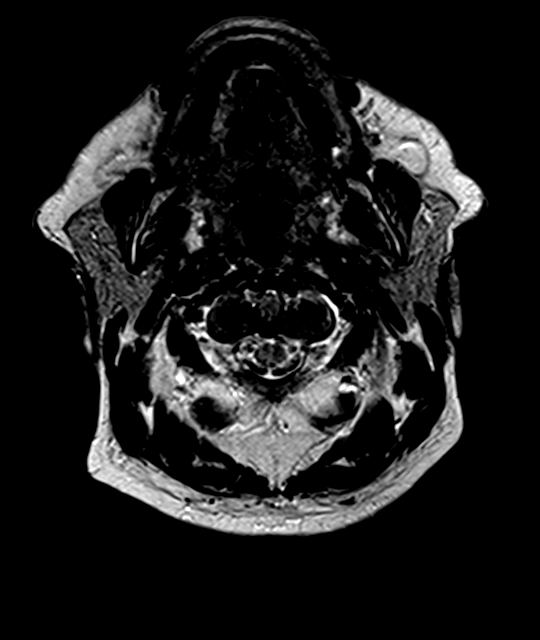

[Series 7: DWI · axial · 4.0mm · 1.36mm/px · 1 of 36 slices shown (1 of 2)]
[im 1/36]
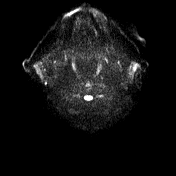

[Series 8: DWI · axial · 4.0mm · 1.36mm/px · 1 of 35 slices shown (2 of 2)]
[im 1/35]
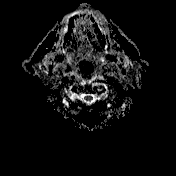

[Series 9: flash_axial · axial · 4.0mm · 0.94mm/px · 1 of 35 slices shown]
[im 1/35]
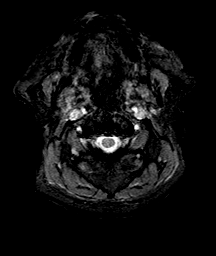

[Series 10: t1_mprage axial · axial · 1.0mm · 0.94mm/px · z∈[-60,+130]mm · 6 of 188 slices shown]
[im 1/188]
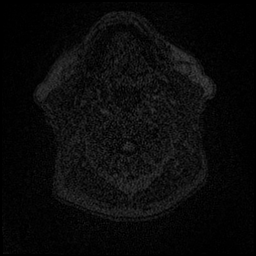
[im 38/188]
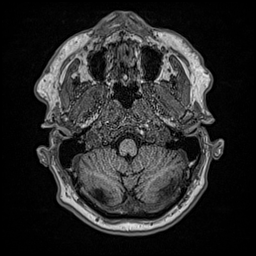
[im 75/188]
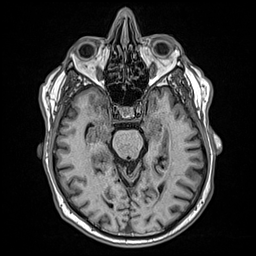
[im 113/188]
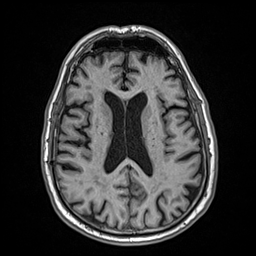
[im 150/188]
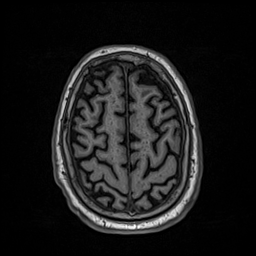
[im 188/188]
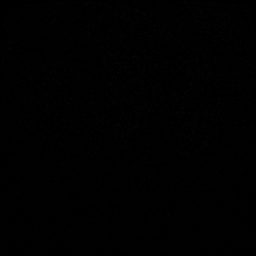

[Series 11: t1_mprage axial_mpr_mprage cor · coronal · 1.0mm · 0.47mm/px · 6 of 190 slices shown]
[im 1/190]
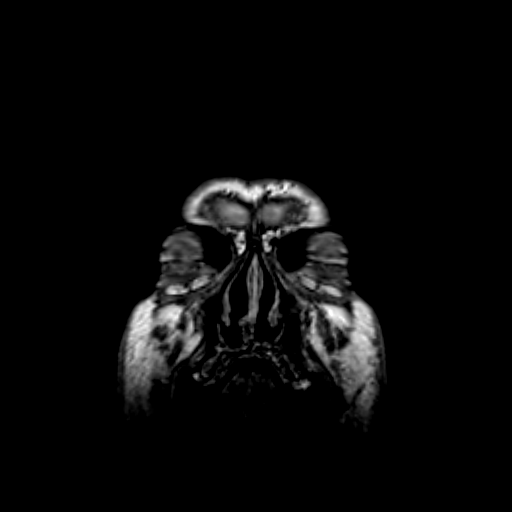
[im 38/190]
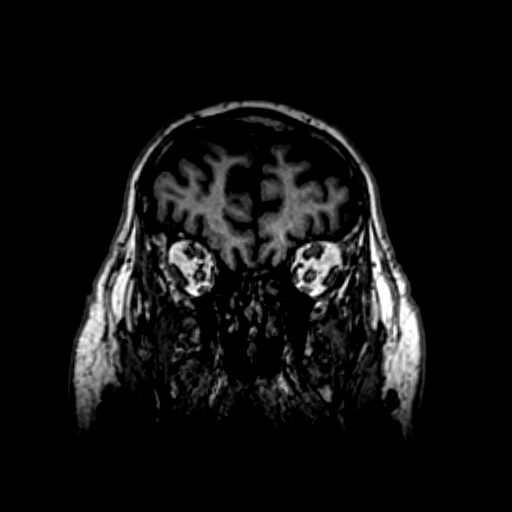
[im 76/190]
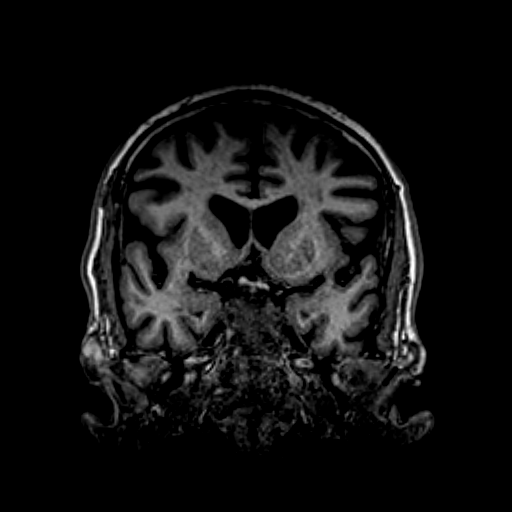
[im 114/190]
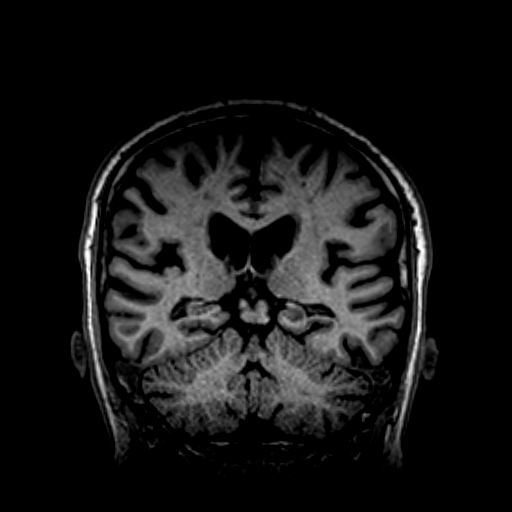
[im 152/190]
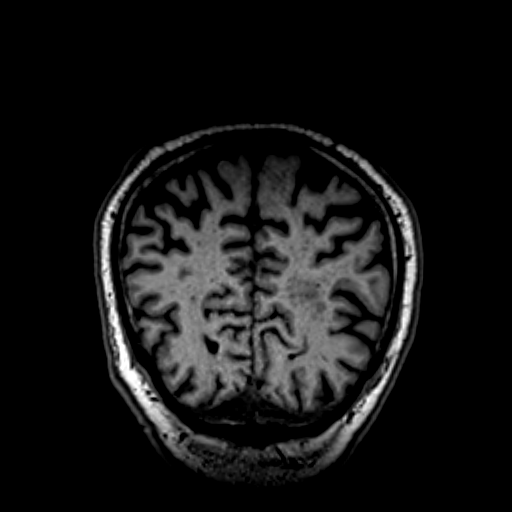
[im 190/190]
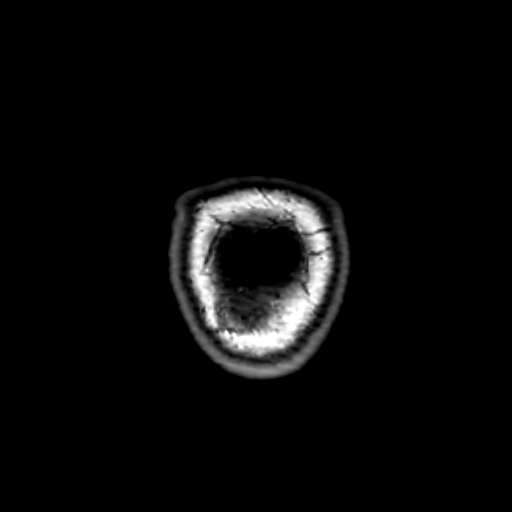

[Series 12: t1_mprage axial_mpr_mprage sag · sagittal · 1.0mm · 0.47mm/px · 6 of 169 slices shown]
[im 1/169]
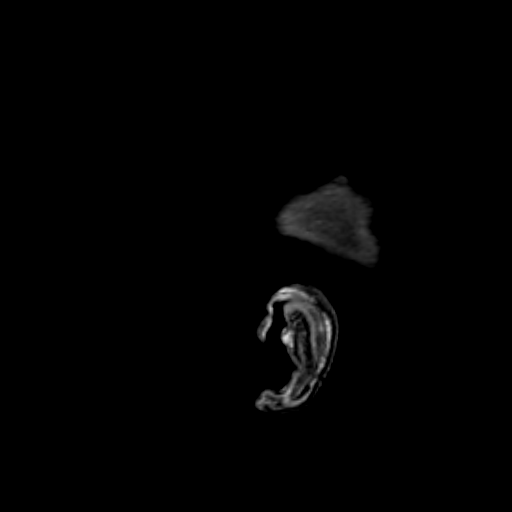
[im 34/169]
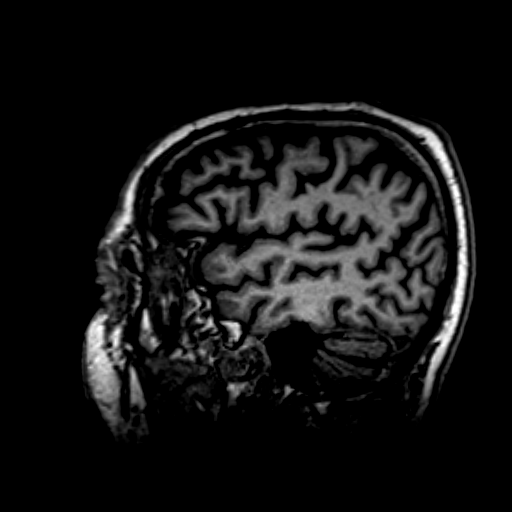
[im 68/169]
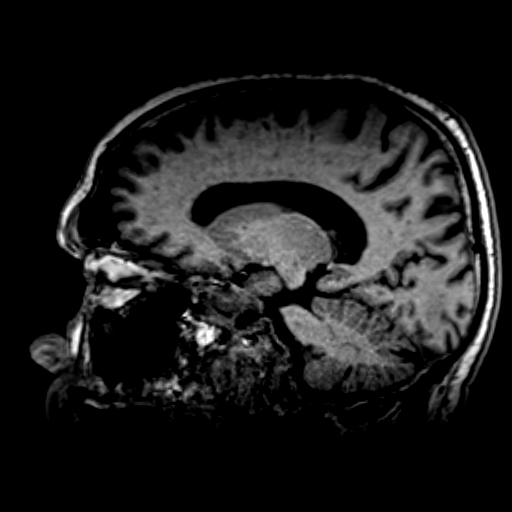
[im 101/169]
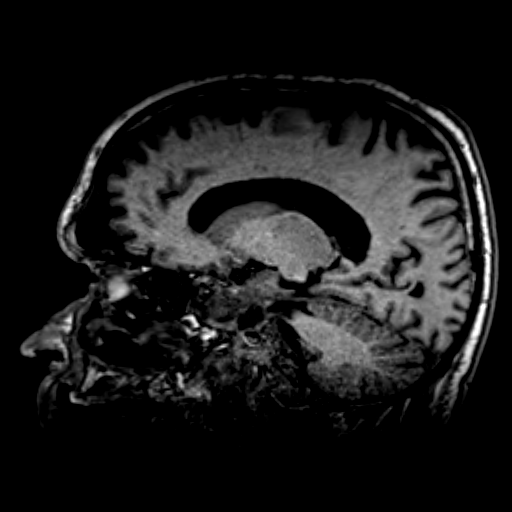
[im 135/169]
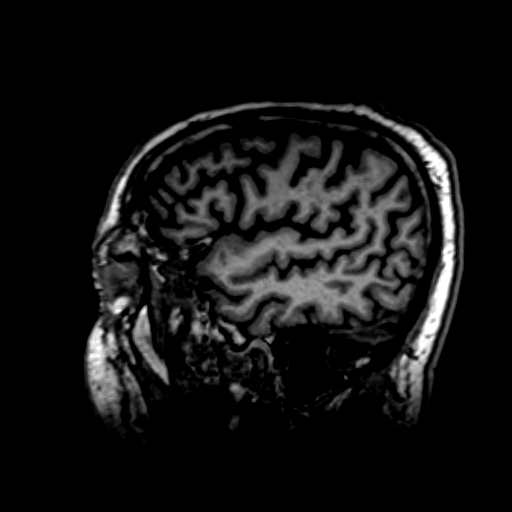
[im 169/169]
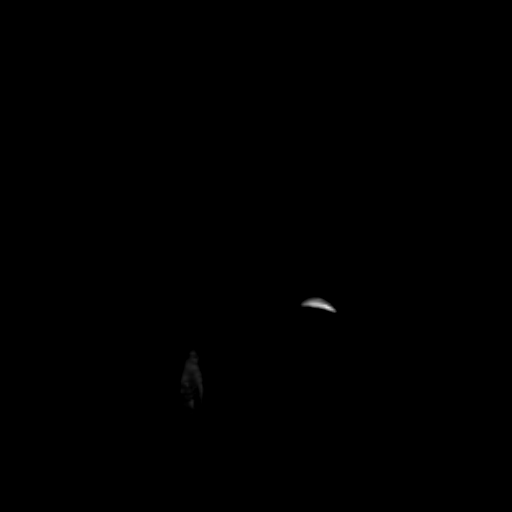

[Series 13: t1_mprage axial+c · axial · 1.0mm · 0.94mm/px · z∈[-60,+130]mm · 6 of 189 slices shown]
[im 1/189]
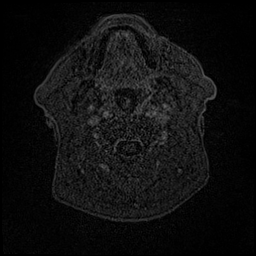
[im 38/189]
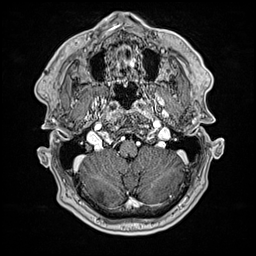
[im 76/189]
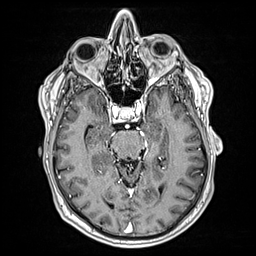
[im 113/189]
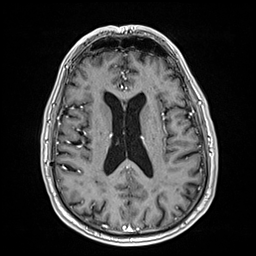
[im 151/189]
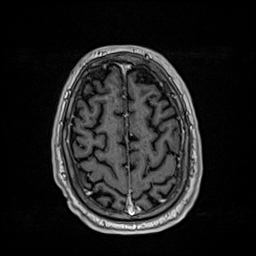
[im 189/189]
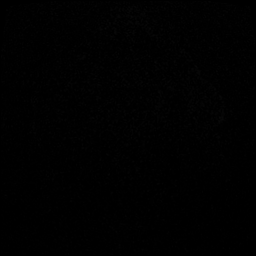

[Series 14: t1_mprage axial+c_mpr_mprage cor · coronal · 1.0mm · 0.47mm/px · 1 of 190 slices shown]
[im 1/190]
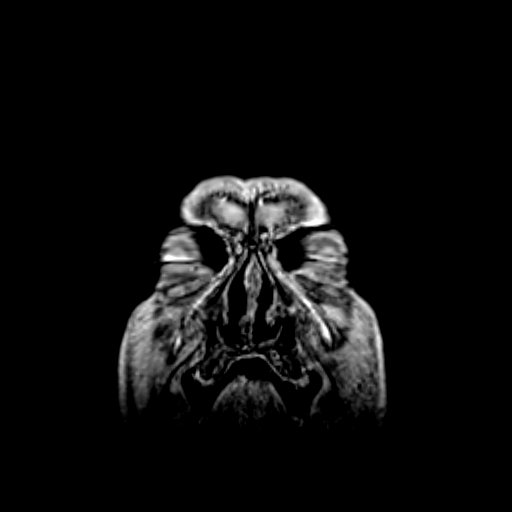

[31 of 48 positions shown; findings below may reference images not displayed]

FINDINGS: BRAIN PARENCHYMA: 

*
No acute infarct.

*
No abnormal intracranial signal or susceptibility.

*
No abnormal intracranial enhancement.

PITUITARY:

*
 Unremarkable.

VASCULATURE: 

*
Expected major intracranial flow voids are present.

VENTRICULAR SYSTEM: 

*
No hydrocephalus or midline shift.

EXTRA-AXIAL SPACES: 

*
No intra- or extra-axial fluid collections.

ORBITS:

*
 Unremarkable.

PARANASAL SINUSES AND MASTOID AIR CELLS: 

*
Predominantly clear.

BONES: 

*
Unremarkable.
IMPRESSION: No evidence of intracranial metastatic disease.

## 2022-02-12 IMAGING — PT PET CT SKULL BASE TO THIGH_STAGING
1 of 3 series · 1 of 25 positions shown · non-contrast
Comparison: none

[Series 4: ct pet 4.0 br38 · axial · 4.0mm · 0.98mm/px · 1 of 320 slices shown]
[im 320/320  brain]
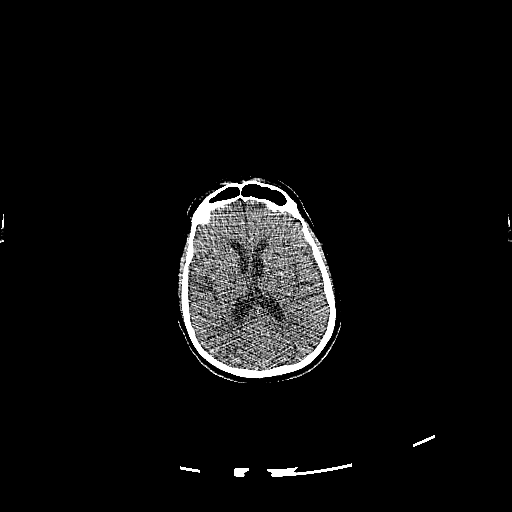

[1 of 25 positions shown; findings below may reference images not displayed]

REASON FOR EXAM

*
Solitary pulmonary nodule

COMPARISON

*
CT chest 07/24/2021

TECHNIQUE

*
Height 66 inches

*
Weight 209.0 pounds

*
Serum glucose 95 mg/dL

*
12.10 mCi F-18 Fluorodeoxyglucose was administered intravenously in a left upper extremity vein

*
70 minutes rest period after injection

*
Imaging field of view was skull base to mid-thigh

*
PET images were acquired and corrected for attenuation and time of flight

*
CT images were acquired without IV or oral contrast

*
PET, CT, and fused PET/CT images were reviewed at an independent reading station

*
Unless otherwise indicated, the standardized uptake value (SUV) is the maximum single pixel intensity within a region of interest, corrected for lean body mass

FINDINGS

BACKGROUND

*
Previous Liver is not applicable

*
Current Liver 1.5 SUVmean

*
PERCIST Threshold 2.6 SUVpeak

METABOLIC FINDINGS

Primary Tumor

*
Primary tumor probably arose from the right lung, upper lobe, but the exact location is not well seen on this exam

*
On 07/24/2021, a branching nodule was described in the right lung, upper lobe, apical segment. This nodule has not changed much since 07/24/2021; it may all be within a peripheral airway. Currently, this nodule measures 1.0 x 0.4 cm with activity of 1.5 SUV. On today's exam, this finding is confounded by central narrowing of the right-sided airways--producing mild patchy small airway inflammation about the right upper lobe

*
TX

Lymph Nodes

*
On today's exam, the right hilar/suprahilar disease is described as malignant lymph nodes (rather than primary tumor)

*
Peribronchial lymph nodes just extend to the adjacent ipsilateral mediastinum (described as N2 disease)

*
About 10 FDG-avid lymph nodes (difficult to separate on noncontrast CT and PET), a few short axis measurements given below

*
4R (99)   6.5 SUV   2.1 cm

*
10R (99)   7.6 SUV   1.4 cm

*
10R (99)   7.5 SUV   1.4 cm

*
11R (102)   3.4 SUV   1.7 cm

*
11R (106)   6.3 SUV   1.6 cm

*
These lymph nodes were not enlarged on 07/24/2021

*
N2

Metastases

*
No FDG-avid extrathoracic metastases

*
No pleural effusion

*
M0

Additional Activity

*
Mild enthesitis at the right femoral greater trochanter (260)   2.7 SUV

ANATOMIC FINDINGS

*
CABG with mildly enlarged left-sided chambers

*
Small left hydrocele

*
ACDF

IMPRESSION

*
PET findings are typical of right lung cancer. Clinical stage group IIIA (TX, N2, M0)

*
Location of the primary tumor is uncertain on this exam. See discussion above about the right apical lung nodule

*
On today's exam, the right pulmonary hilar/suprahilar disease is described as malignant lymph nodes

*
Negative exam for distant metastases

*
Bronchoscopy may be a good avenue for tissue sampling

## 2022-04-17 IMAGING — CT CT THORAX WO CONTRAST
2 of 5 series · 13 of 36 positions shown, 16 images · non-contrast
Comparison: January 28, 2022 and CT PET February 12, 2022
Coronary Artery Calcification: Present

Images Obtained from [facility]
HISTORY: Solitary pulmonary nodule,
TECHNIQUE: Axial images of the chest were obtained from the lung apices through the lung bases.
Dose reduction technique used: Automated exposure control and adjustment of the mA and/or kV according to patient size. CT Studies and Cardiac Nuclear Medicine Studies in last 12 months = 2

[Series 4: coronal · coronal · 0.76mm/px · 3 of 71 slices shown]
[im 15/71  lung]
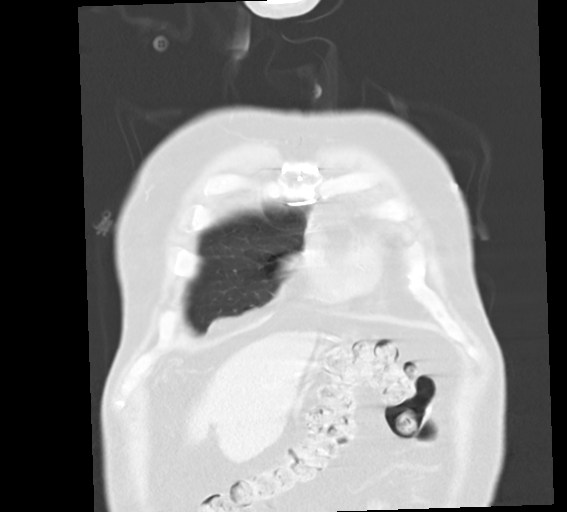
[im 29/71  lung]
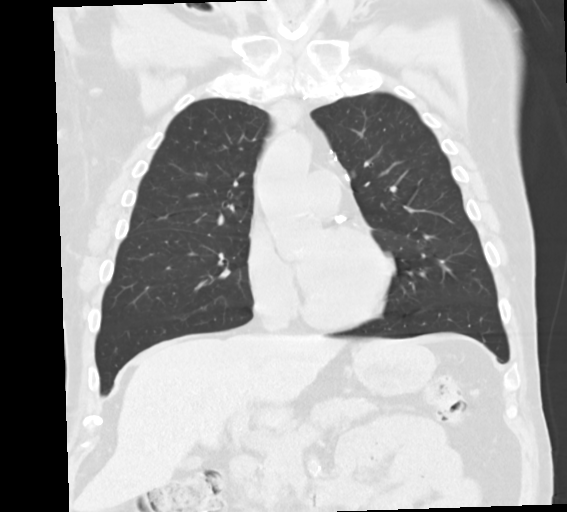
[im 43/71  lung]
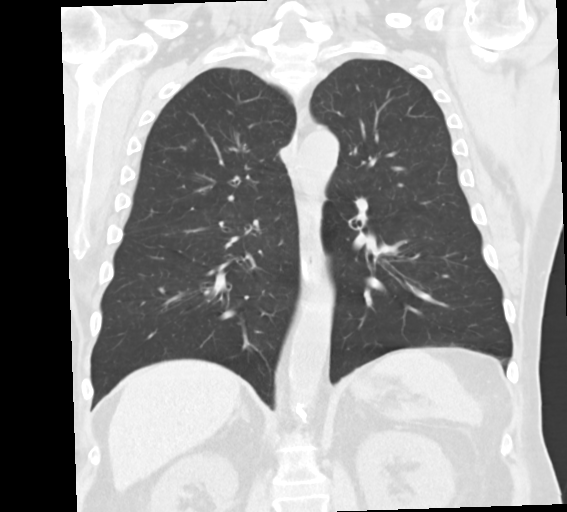

[Series 8: lung · axial · 0.71mm/px · z∈[-1203,-967]mm · 10 of 136 slices shown, 13 images]
[im 9/136  mediastinal]
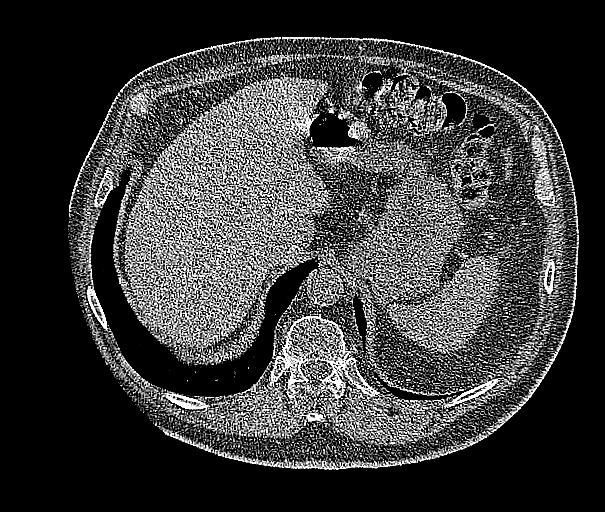
[im 9/136  lung]
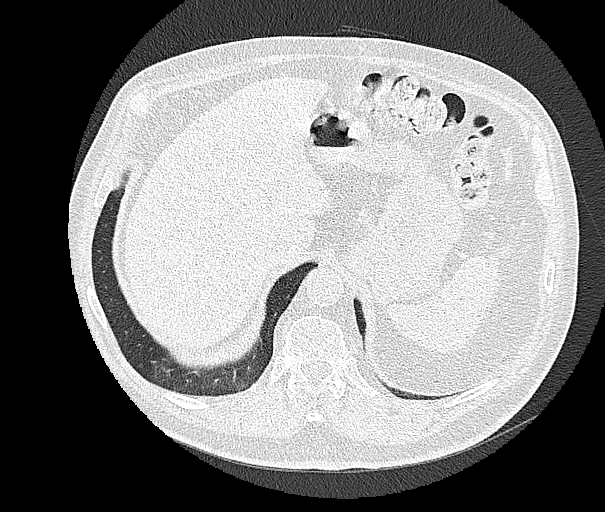
[im 26/136  lung]
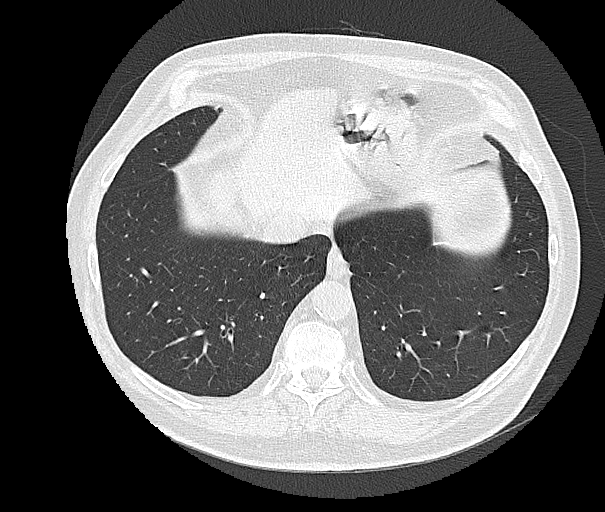
[im 34/136  lung]
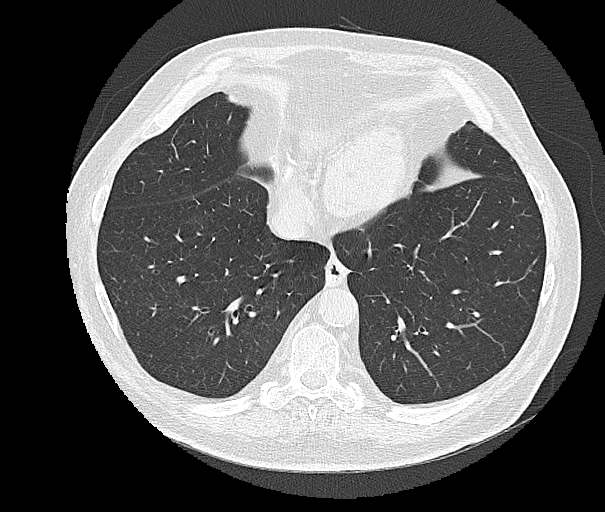
[im 51/136  lung]
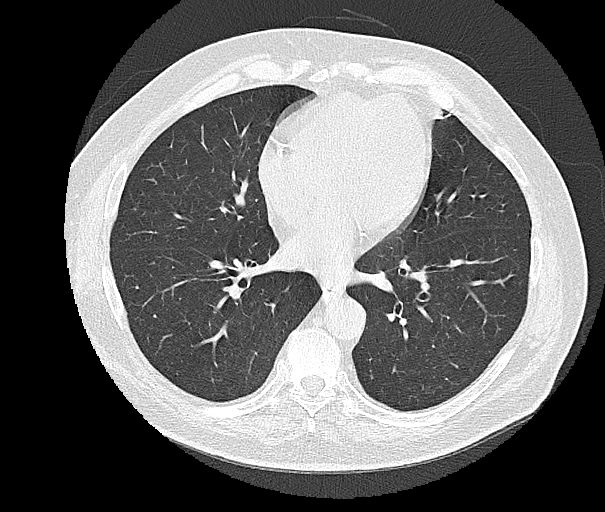
[im 60/136  mediastinal]
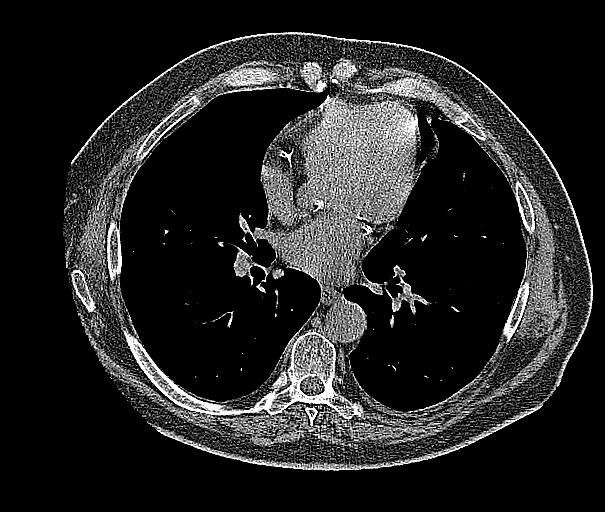
[im 60/136  lung]
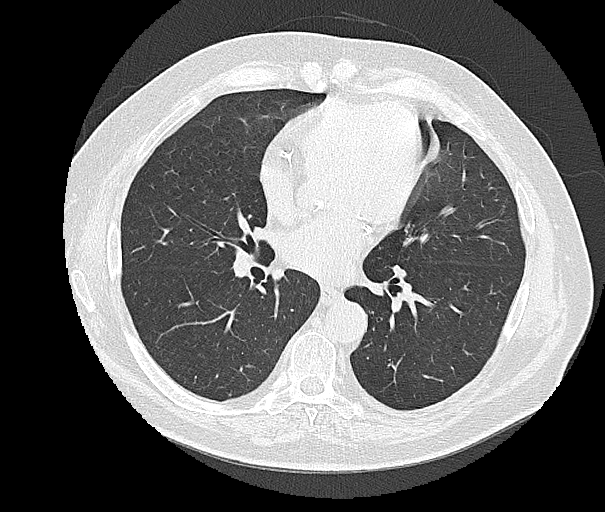
[im 76/136  lung]
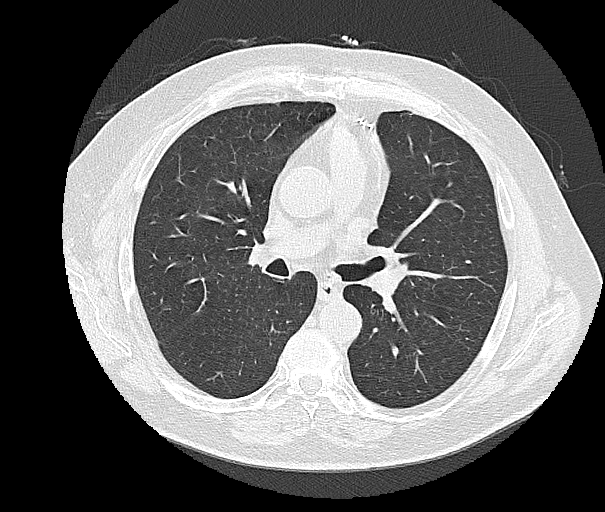
[im 85/136  lung]
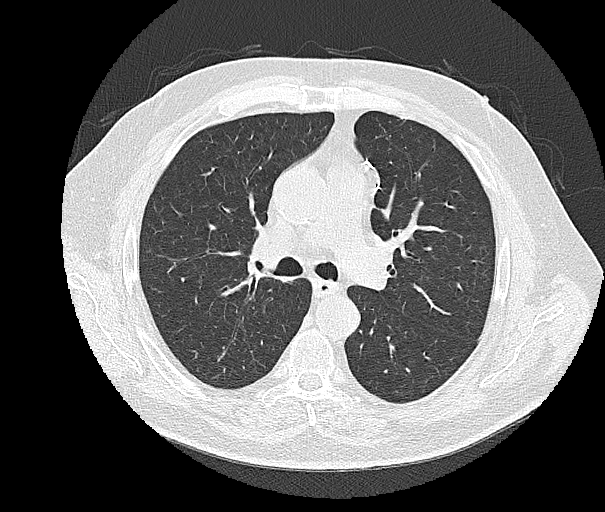
[im 102/136  lung]
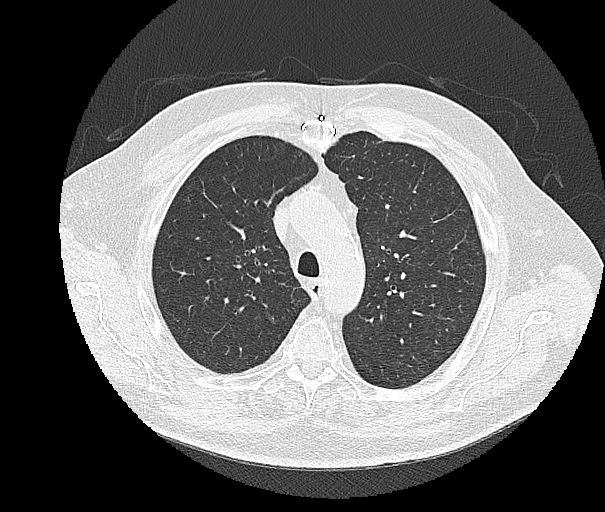
[im 110/136  mediastinal]
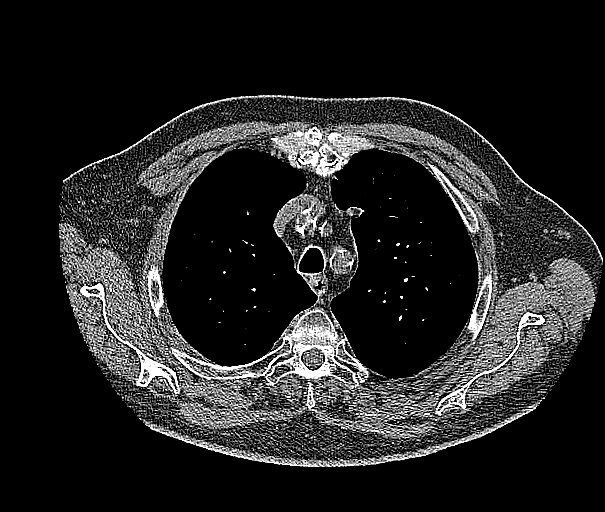
[im 110/136  lung]
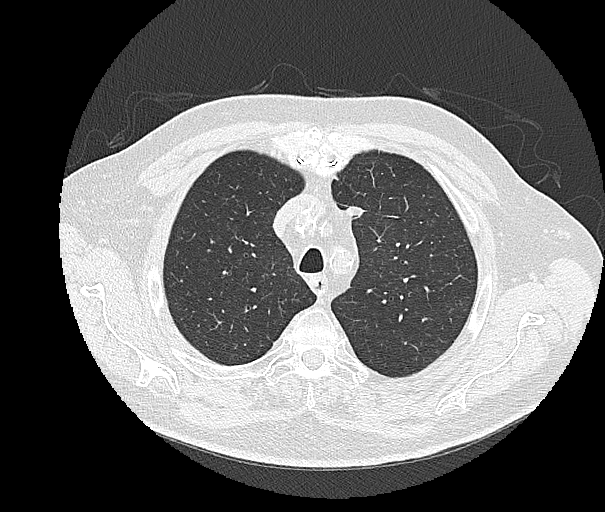
[im 127/136  lung]
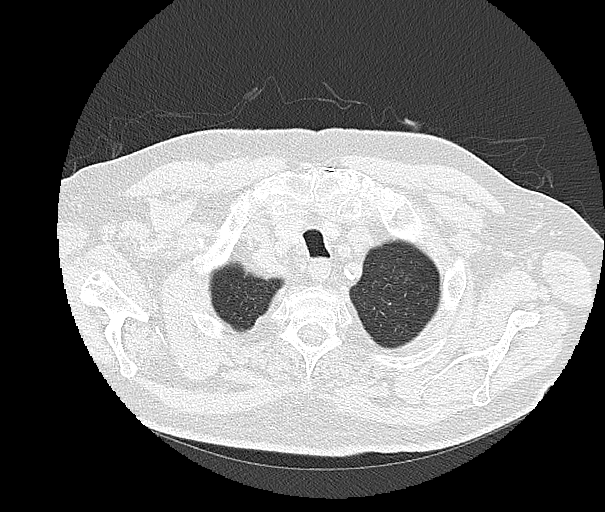

[13 of 36 positions shown; findings below may reference images not displayed]

FINDINGS: The central airways are patent.
There is a small, irregular-shaped opacity seen in the subpleural posterior right upper lobe new from previous exam. It measures approximately 9 mm. There has been near complete resolution or
significant reduction of a right perihilar mass compared to previous exam.
There are no pleural effusions.
The heart is normal in size without significant pericardial effusion. Post median sternotomy changes are present.
No abnormally enlarged lymph nodes are identified. Significant reduction in previous mediastinal adenopathy is noted.
Visualized abdominal contents show atherosclerotic vascular calcification to the origins of the visceral arteries.
Musculoskeletal system shows post cervical fusion changes lower cervical spine.
IMPRESSION: 1.\X09\Virtual resolution of the right perihilar mass and mediastinal adenopathy.
2.\X09\New, somewhat linear relatively small, 9 mm opacity in the posterior right upper lobe. Favor benign process over malignancy. Follow-up as indicated clinically.
Total radiation dose to patient is CTDIvol 3.30 mGy and DLP 142.00 mGy-cm.

## 2022-06-17 IMAGING — CT PET CT SKULL BASE TO THIGH_RESTAGING
1 of 3 series · 1 of 25 positions shown · non-contrast
Comparison: PET scan, 02/12/22

Images Obtained from [facility]
Height: 66 inches. Weight: 194 pounds.
INDICATION: Bronchogenic carcinoma, right lung, diagnosis February 2022 status post chemotherapy and radiation therapy; restaging.
TECHNIQUE: The patient's serum glucose was 97 mg/dL at time of the study.  The patient was intravenously injected with 10.9 mCi F-18 Fluorodeoxyglucose in a left antecubital vein.  The patient rested
quietly for 65 minutes and then received attenuation corrected PET/CT imaging with Time of Flight Protocol from the skull base to mid thigh. PET, CT, and fused images were reviewed at the reading
station. SUV is corrected based on lean body mass. Images are adequate for review.

[Series 3: ct pet 4.0 br38 · axial · 4.0mm · 0.98mm/px · 1 of 320 slices shown]
[im 320/320  brain]
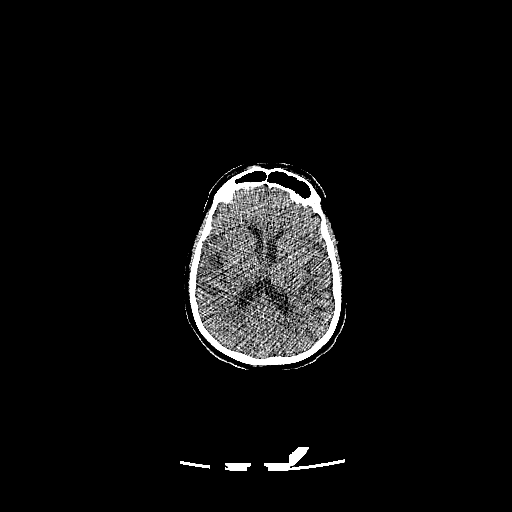

[1 of 25 positions shown; findings below may reference images not displayed]

FINDINGS: HEAD/NECK:
PET: The caudal margin of brain shows normal FDG activity. There is normal soft tissue neck uptake of radiotracer with no evidence of primary or metastatic malignancy or other findings.
CT:  Caudal margin of brain is normal except for mild cerebral atrophy. Orbits and paranasal sinuses are normal. Normal soft tissue neck anatomy is present. Atheromatous calcifications are seen at
the carotid bifurcations with medial deviation of the right proximal internal carotid artery effacing the left side of the piriform sinus. Thoracic inlet and thyroid gland are normal.
THORAX:
PET: The background mediastinal maximum SUV is 1.8. There is normal uptake of radiotracer in the chest wall and axillary regions and thoracic inlet. FDG avid lymphadenopathy associated with the right
hilus on previous exam shows high-grade response to therapy, with standard uptake of 2.7, previous 7.6, on image 101 with mass decreasing in size from 6.4 cm to 2.0 cm.
There is new low level activity in the lingular segment left upper lobe related to patchy infiltrate on CT, most consistent with subclinical pneumonia and should be correlated clinically. No
additional chest findings are seen.
CT: Chest wall, axillary regions and thoracic inlet are normal. Dense vascular calcifications are seen in the aorta, brachiocephalic vessels and coronary system status post median sternotomy. Right
hilar mass has decreased significantly in size compared to previous CT performed with PET on 02/12/22 and no additional mediastinal findings are present.
Lungs are clear except for new patchy infiltrate in the lingular segment left upper lobe most consistent with minimal/opportunistic pneumonia.
ABDOMEN/PELVIS:
PET:  The background liver mean SUV is  2.1. PERCIST Threshold is 2.7. There is normal uptake of radiotracer in the liver, spleen, adrenal glands and other major organs in the abdomen and pelvis with
no evidence of primary or metastatic disease or change compared to prior exam.
CT: The liver, spleen, gallbladder and biliary system, pancreas, adrenal glands and kidneys are normal. Vascular calcifications are seen in the abdominal aorta without aneurysm. No intraperitoneal
findings are seen other than for moderate constipation and dense vascular calcifications in the iliofemoral arterial system of the pelvis. Prostate gland and bladder are normal and no inguinal
findings are present.
SKELETAL:
PET: There is normal osseous uptake of radiotracer in the skeletal system with no evidence of metastatic bone disease or other findings.
CT: Normal osseous anatomy is present other than for degenerative changes in disc spaces and facets of spine and postsurgical changes in the lower cervical spine with anterior fusion procedure.
IMPRESSION: 1. Today's PET/CT is compared to PET scan of 02/12/22. High-grade partial response has occurred with respect to the right hilar bronchogenic carcinoma compared to PET scan of 02/12/22. No regional
lymph node or distant metastases are present.
2.  Low-level activity is seen in the lingular segment left upper lobe corresponding to patchy infiltrate developing since prior exam consistent with mild/subclinical bronchopneumonia.
3. Severe atheromatous vascular disease as noted above.

## 2022-08-06 IMAGING — MR MRI BRAIN W/WO CONTRAST
12 series · 48 of 48 positions shown · IV contrast (prohance)
Comparison: MRI brain 02/12/22

Images Obtained from [facility]
INDICATION: 66-year-old male with malignant neoplasm of unspecified part of unspecified bronchus or lung.
TECHNIQUE: Multiplanar, multisequence MRI of the brain was performed without and with intravenous contrast. The patient received an intravenous dose of 15 mL ProHance.

[Series 5: flair_axial fs · axial · 4.0mm · 0.75mm/px · 1 of 36 slices shown]
[im 1/36]
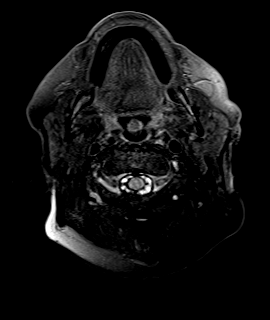

[Series 6: t2_axial · axial · 4.0mm · 0.38mm/px · 1 of 36 slices shown]
[im 1/36]
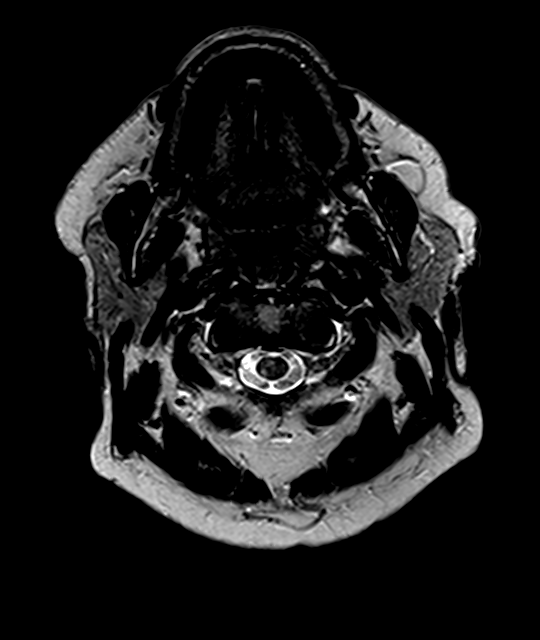

[Series 7: DWI · axial · 4.0mm · 1.36mm/px · 1 of 36 slices shown (1 of 2)]
[im 1/36]
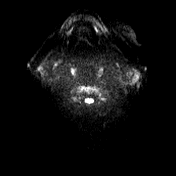

[Series 8: DWI · axial · 4.0mm · 1.36mm/px · 1 of 35 slices shown (2 of 2)]
[im 1/35]
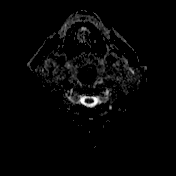

[Series 9: t2_swi_(person_name)_(person_name)2_1.6mm_swi · axial · 1.6mm · 0.75mm/px · z∈[-51,+88]mm · 3 of 88 slices shown]
[im 1/88]
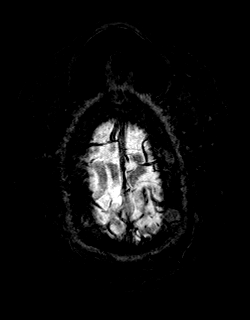
[im 44/88]
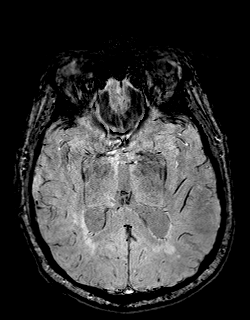
[im 88/88]
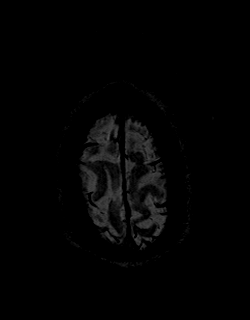

[Series 10: t2_swi_(person_name)_(person_name)2_1.6mm_swi_mip · axial · 12.8mm · 0.75mm/px · z∈[-45,+83]mm · 3 of 81 slices shown]
[im 1/81]
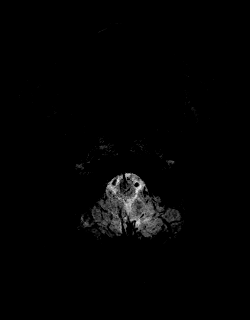
[im 41/81]
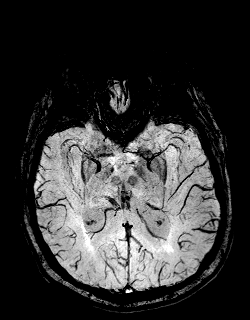
[im 81/81]
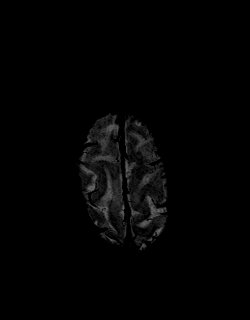

[Series 11: t1_mprage axial · axial · 1.0mm · 0.94mm/px · z∈[-74,+110]mm · 6 of 185 slices shown]
[im 1/185]
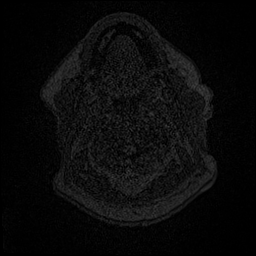
[im 37/185]
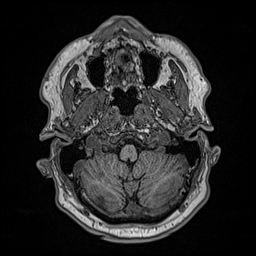
[im 74/185]
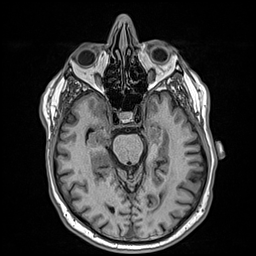
[im 111/185]
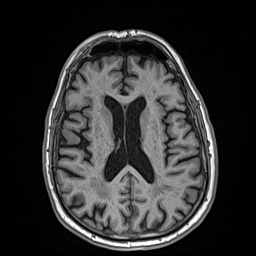
[im 148/185]
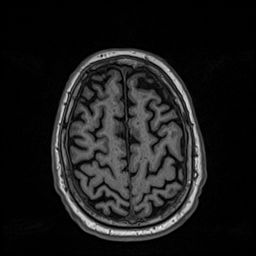
[im 185/185]
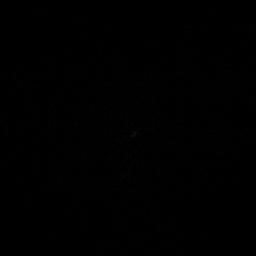

[Series 12: t1_mprage axial_mpr_mprage sag · sagittal · 1.0mm · 0.94mm/px · 6 of 170 slices shown]
[im 1/170]
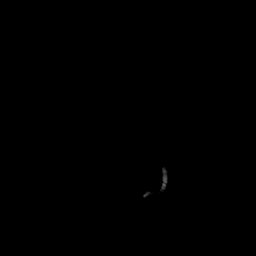
[im 34/170]
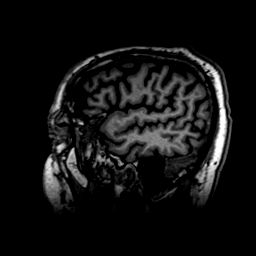
[im 68/170]
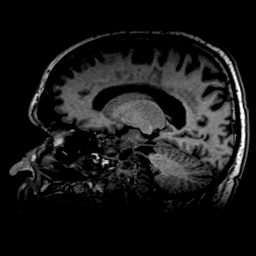
[im 102/170]
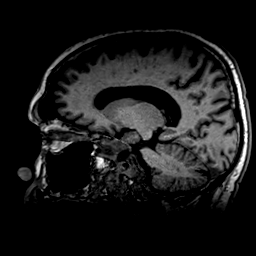
[im 136/170]
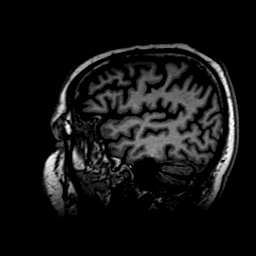
[im 170/170]
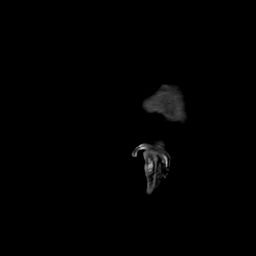

[Series 13: t1_mprage axial_mpr_mprage cor · coronal · 1.0mm · 0.94mm/px · 7 of 190 slices shown]
[im 1/190]
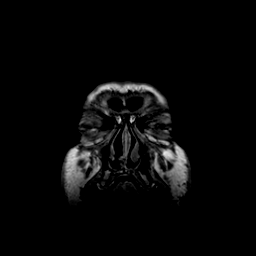
[im 32/190]
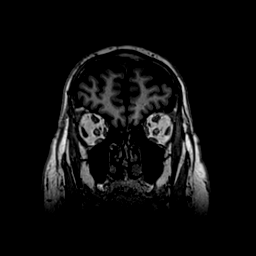
[im 64/190]
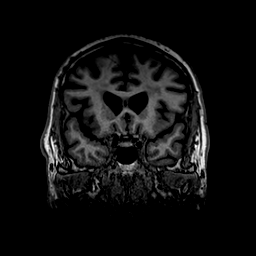
[im 95/190]
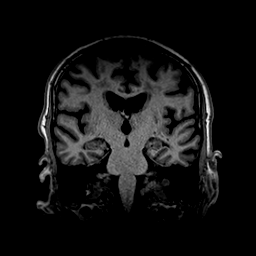
[im 127/190]
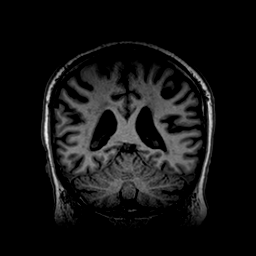
[im 158/190]
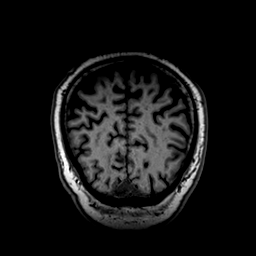
[im 190/190]
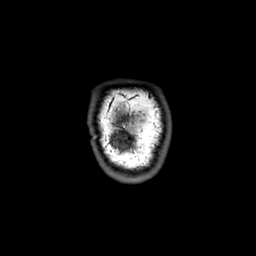

[Series 14: t1_mprage axial+c · axial · 1.0mm · 0.94mm/px · z∈[-74,+117]mm · 6 of 188 slices shown]
[im 1/188]
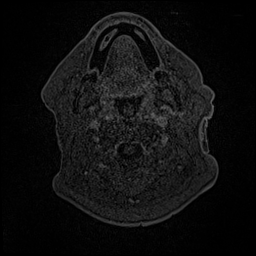
[im 38/188]
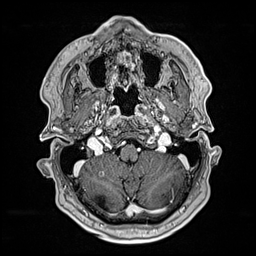
[im 75/188]
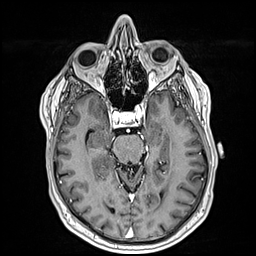
[im 113/188]
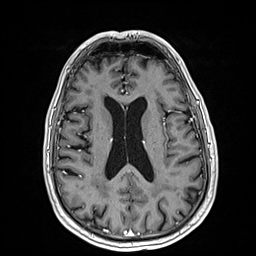
[im 150/188]
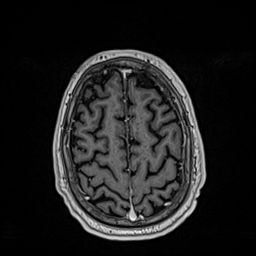
[im 188/188]
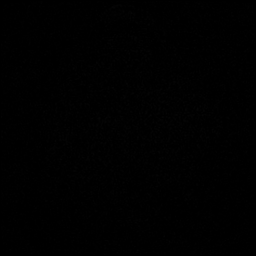

[Series 15: t1_mprage axial+c_mpr_mprage sag · sagittal · 1.0mm · 0.94mm/px · 6 of 168 slices shown]
[im 1/168]
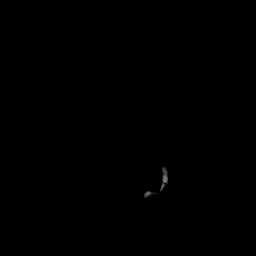
[im 34/168]
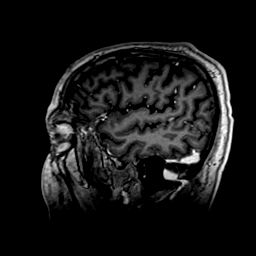
[im 67/168]
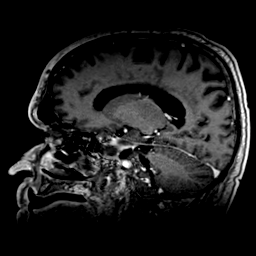
[im 101/168]
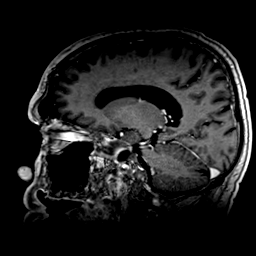
[im 134/168]
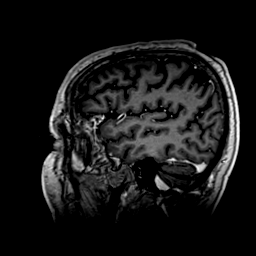
[im 168/168]
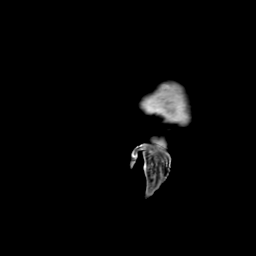

[Series 16: t1_mprage axial+c_mpr_mprage cor · coronal · 1.0mm · 0.94mm/px · 7 of 190 slices shown]
[im 1/190]
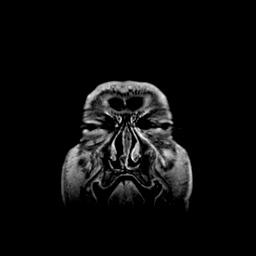
[im 32/190]
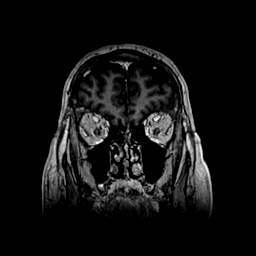
[im 64/190]
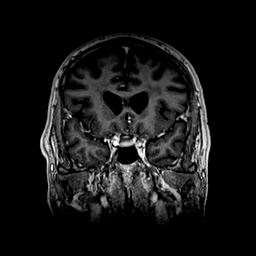
[im 95/190]
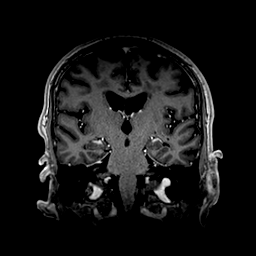
[im 127/190]
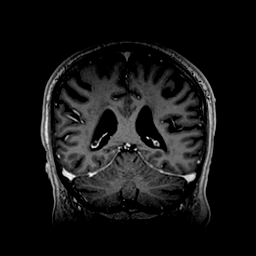
[im 158/190]
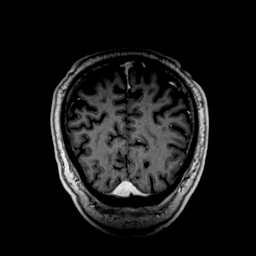
[im 190/190]
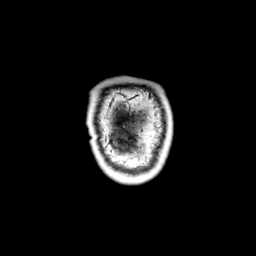

[48 of 48 positions shown; findings below may reference images not displayed]

FINDINGS: Brain parenchyma: New peripherally enhancing lesions in the right medial parietal lobe measuring 6 x 6 mm ([DATE]) and right cerebellum measuring 7 x 6 mm ([DATE]). Mild vasogenic edema without
significant mass effect. Additional questionable lesion in the inferior left occipital lobe measuring 3 mm ([DATE]). No acute infarct or intracranial hemorrhage. Moderate to severe chronic
microangiopathic change.
Ventricles: No midline shift, herniation or hydrocephalus.
Extra-axial spaces: No extra-axial fluid collection.
Extracranial structures: Paranasal sinuses, mastoid air cells and middle ear cavities without significant disease. Marrow signal normal. Orbits unremarkable. Soft tissues normal.
IMPRESSION: Two new metastatic lesions in the right medial parietal lobe and right cerebellum measuring up to 7 mm. Questionable additional metastatic lesion in the inferior left occipital lobe measuring 3 mm.
No significant mass effect.
ABNORMAL FINDINGS!

## 2022-09-11 IMAGING — OT DXA BONE DENSITY
2 series · 2 of 2 positions shown · non-contrast
Comparison: none

Images Obtained from [facility]
REASON FOR EXAM: Evaluate bone density.
RISK FACTORS:  None
PRIOR EXAMS:  None
METHOD:  Scans of the spine and hip were performed using dual energy X-ray densitometry (DXA).

[Series 1: — · 1 of 1 slices shown (1 of 2)]
[im 1/1]
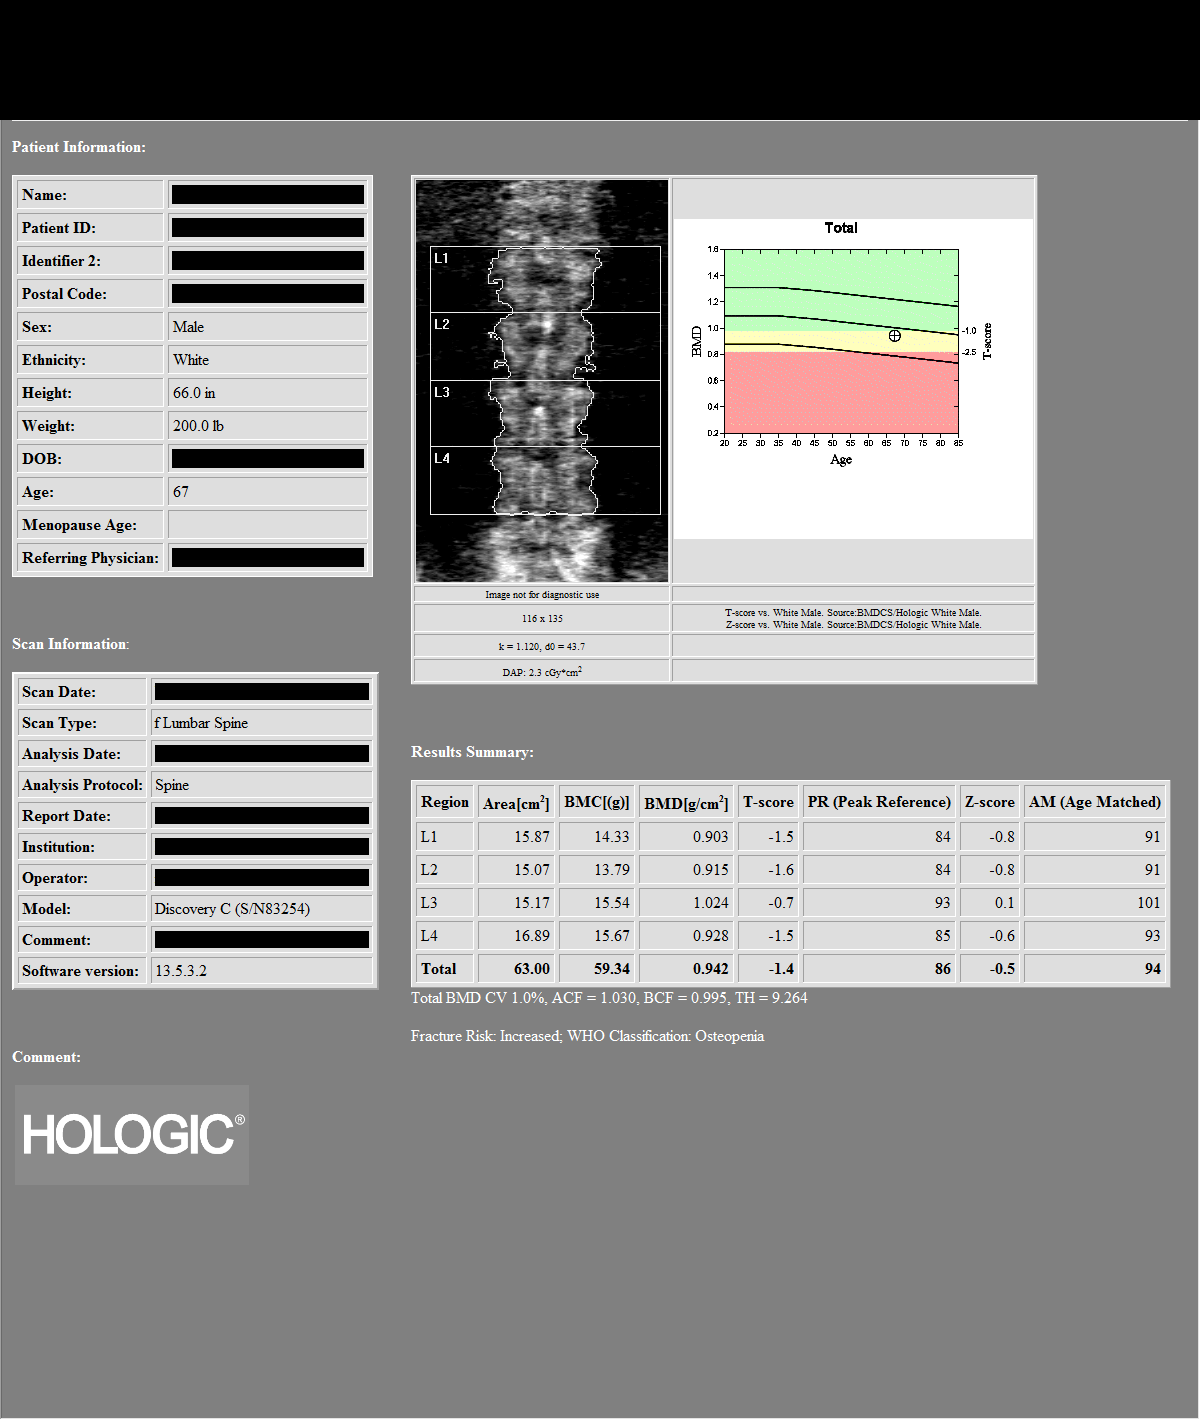

[Series 2: — · left · 1 of 1 slices shown (2 of 2)]
[im 1/1]
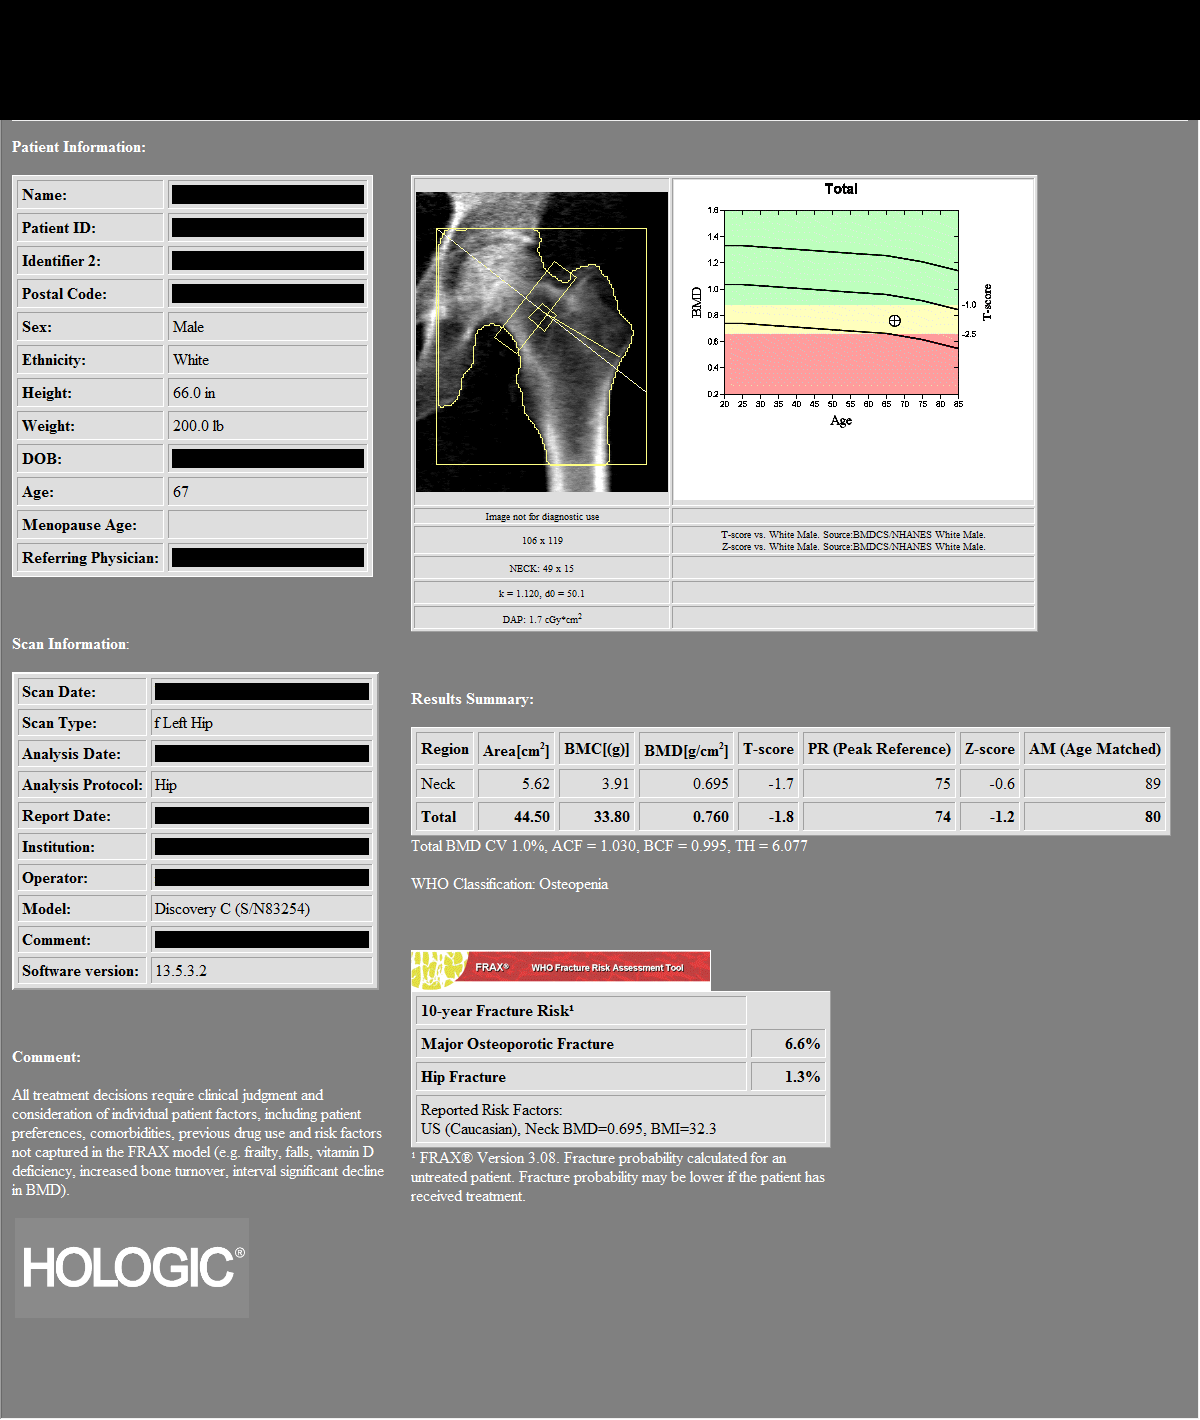

[2 of 2 positions shown; findings below may reference images not displayed]

IMPRESSION: As defined by World Health Organization, the patient meets the criteria for OSTEOPENIA based on spine and left hip T-scores.
PATIENT DEMOGRAPHICS:  66 years old White  Male.
FINDINGS: 1.    Review of scanogram images shows no factor invalidating scan results.
2.    The lumbar spine exam using L1-L4 regions shows average Bone Mineral Density is 0.942 gm/cm2 of Hydroxyapatite.  The T-score (comparing patient with a young adult group) is 1.4 standard
deviations BELOW mean. The Z-score (comparing patient with an age-matched group) is 0.5 standard deviations BELOW mean.
3.  The left hip exam using total region of interest shows average Bone Mineral Density is 0.760 gm/cm2 of Hydroxyapatite. The T-score (comparing patient with a young adult group) is 1.8 standard
deviations BELOW mean. The Z-score (comparing patient with an age-matched group) is 1.2 standard deviations BELOW mean.
According to the World Health Organization risk assessment tool (FRAX) for osteopenia only, which uses the femoral neck T score and includes other patient risk factors for fracture, the patient has a
10-year absolute risk of hip fracture of 1.3% and 10-year absolute risk fracture for any major fracture of 6.6%. Clinicians judgment and/or patient preferences may indicate treatment for people with
10-year fracture probabilities above or below these levels.
RECOMMENDATIONS:  The patient states that he is taking vitamin D and calcium on a regular basis.  The patient should continue being a nonsmoker and regular exercise to patient tolerance would be of
benefit.  The patient is currently not taking prescribed medication for prevention of bone loss.  According to criteria established by the national osteoporosis foundation, the patient DOES NOT meet
the current indications for prescribed medical therapy.
The National Osteoporosis Foundation now recommends followup DXA scanning every two years in patients at risk regardless of whether the patient is undergoing pharmacological treatment.

## 2022-09-23 IMAGING — PT PET CT SKULL BASE TO THIGH_RESTAGING
1 of 3 series · 1 of 25 positions shown · non-contrast
Comparison: PET/CT 06/17/22, MRI brain 08/06/22

Images Obtained from [facility]
******** ADDENDUM #1 ********
ADDENDUM:
Impression #1 should read as follows:  Today's PET/CT is compared to PET/CT 06/17/22, MRI brain 08/06/22. Near resolution of hypermetabolic activity in right hilar bronchogenic malignancy with similar
size on CT. No new FDG avid metastasis. Known intracranial metastasis is suboptimally assessed on PET and best followed with MRI.
Remainder of report is unchanged.
******** ORIGINAL REPORT ********
Height: 66 inches. Weight: 196 pounds.
INDICATION: Right lung cancer status post chemotherapy and radiation.
TECHNIQUE: The patient's serum glucose was 115 mg/dL at time of the study.  The patient was intravenously injected with 12.64 mCi mCi F-18 Fluorodeoxyglucose in a Left Antecubital Area vein. The
patient rested quietly for 69 Minutes and then received attenuation corrected PET/CT imaging with Time of Flight Protocol from the head to mid thigh. PET, CT, and fused images were reviewed at the
reading station. SUV is corrected based on lean body mass. Images are adequate for review.

[Series 4: ct pet 4.0 br38 · axial · 4.0mm · 0.98mm/px · 1 of 362 slices shown]
[im 329/362  brain]
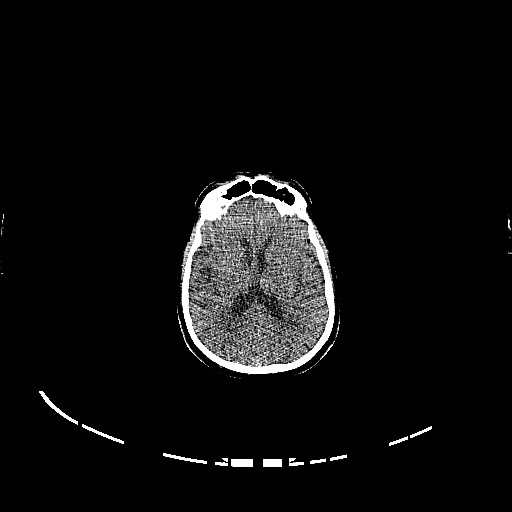

[1 of 25 positions shown; findings below may reference images not displayed]

FINDINGS: Mediastinum SUV max:
Liver SUV mean:
PERCIST threshold:
HEAD/NECK:
PET: No suspicious FDG activity in the head and neck. No hypermetabolic activity at site of previously demonstrated brain metastases, which are better evaluated on MRI.
CT:  No intracranial abnormality. No mass or lymphadenopathy. Moderate to severe carotid calcifications.
THORAX:
PET: Near resolution of hypermetabolic activity in a 2.0 cm right hilar malignancy with SUV max 2.3 (134), previously 2.0 cm with SUV max 2.7.
Resolution of hypermetabolic patchy infiltrate in the left upper lung. Diffuse increased activity in the esophagus, likely inflammatory.
CT: Prior CABG. No cardiomegaly or pericardial effusion. No lymphadenopathy. Unchanged small clustered nodular densities in the lateral basilar segment of the right lower lobe (157). No new pulmonary
lesion.
ABDOMEN/PELVIS:
PET: No suspicious FDG activity in the abdomen and pelvis.
CT: Moderate colonic fecal loading. Mildly enlarged prostate gland. Moderate to severe vascular calcifications.
SKELETAL:
PET: No suspicious FDG activity in the skeletal structures. Mild degenerative activity about the left shoulder.
CT: No suspicious osseous lesion or acute fracture. ACDF from C5 to C7 levels. Grade 1 anterolisthesis of C7 on T1, unchanged. Moderate degenerative changes of the glenohumeral joints. Mild
degenerative changes in the spine and pelvis.
IMPRESSION: 1. Today's PET/CT is compared to PET/CT 06/17/22, MRI brain 08/06/22. Near resolution of hypermetabolic activity in right hilar bronchogenic malignancy with similar size on CT. No new FDG avid
metastasis. Known intracranial metastasis is suboptimally assessed on PET] breast follow-up with MRI.
2. Resolution of hypermetabolic patchy infiltrate in the left upper lung.
3. Diffuse increased activity in the esophagus, likely inflammatory.
4. Severe vascular calcifications.

## 2022-11-18 IMAGING — MR MRI BRAIN W/WO CONTRAST
12 series · 48 of 48 positions shown · IV contrast (15CC PROHANCE)
Comparison: MRI brain 08/06/22

Images Obtained from [facility]
INDICATION: 66 years-old Male with secondary malignant neoplasm of the brain status post radiation therapy.
TECHNIQUE: Multiplanar, multisequence MRI of the brain was performed without and with intravenous contrast. The patient received an intravenous dose of 15 mL ProHance.

[Series 1: bSSFP · axial · 8.0mm · 1.17mm/px · 1 of 19 slices shown]
[im 1/19]
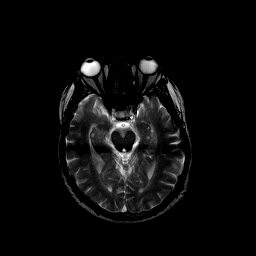

[Series 2: t1_mprage_axial · axial · 1.0mm · 1.00mm/px · z∈[-115,+60]mm · 7 of 176 slices shown]
[im 1/176]
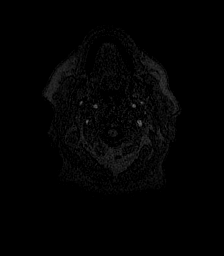
[im 30/176]
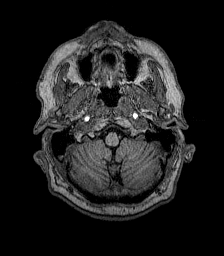
[im 59/176]
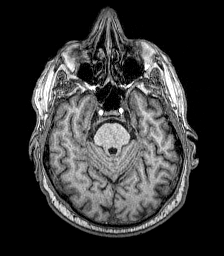
[im 88/176]
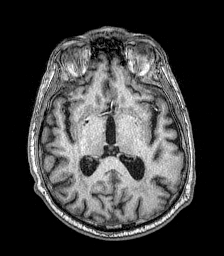
[im 117/176]
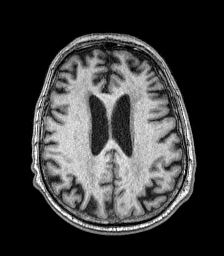
[im 146/176]
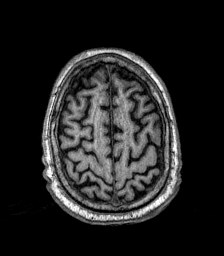
[im 176/176]
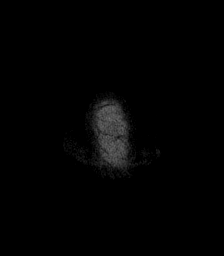

[Series 3: t1_mprage_cor_reformat · coronal · 1.5mm · 1.00mm/px · 8 of 217 slices shown]
[im 1/217]
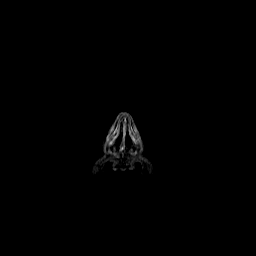
[im 31/217]
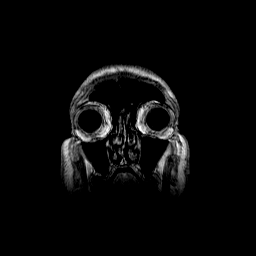
[im 62/217]
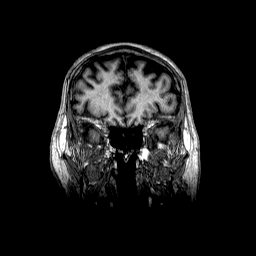
[im 93/217]
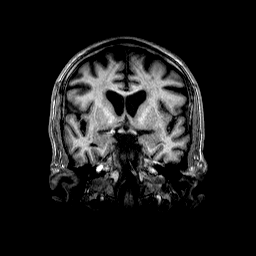
[im 124/217]
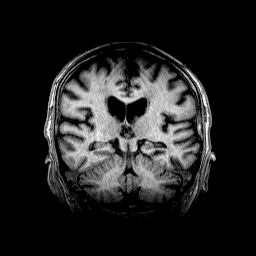
[im 155/217]
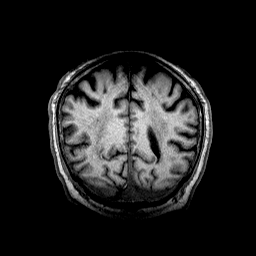
[im 186/217]
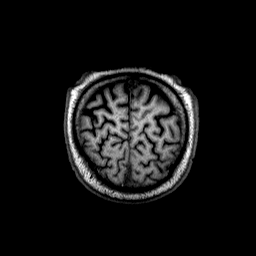
[im 217/217]
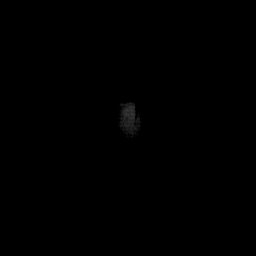

[Series 4: flair_axial_fs · axial · 5.0mm · 0.45mm/px · 1 of 27 slices shown]
[im 1/27]
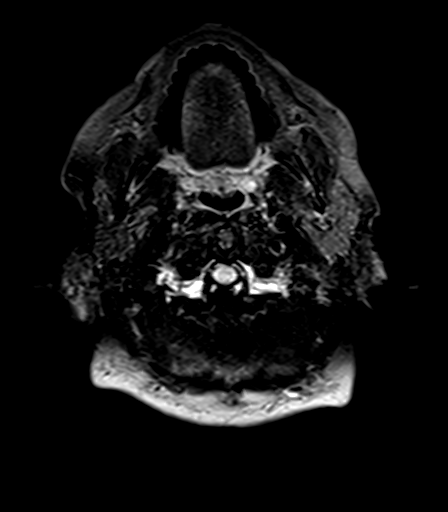

[Series 5: t1_mprage_sag_reformat · sagittal · 1.5mm · 1.09mm/px · 6 of 169 slices shown]
[im 1/169]
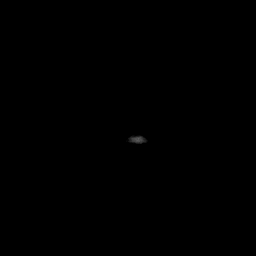
[im 34/169]
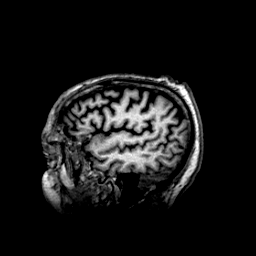
[im 68/169]
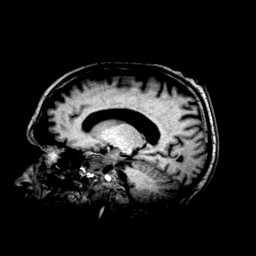
[im 101/169]
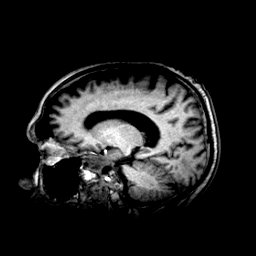
[im 135/169]
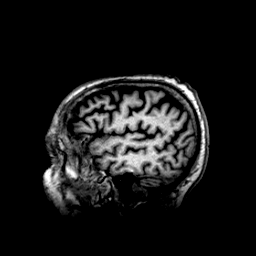
[im 169/169]
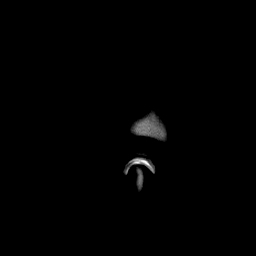

[Series 6: t2_axial · axial · 5.0mm · 0.72mm/px · 1 of 27 slices shown]
[im 1/27]
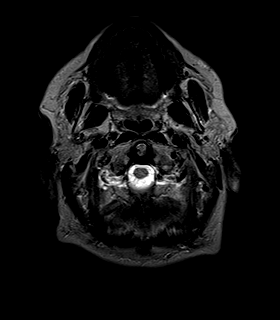

[Series 7: DWI · axial · 5.0mm · 1.26mm/px · 1 of 26 slices shown (1 of 2)]
[im 1/26]
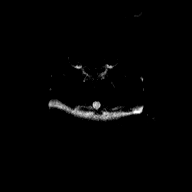

[Series 8: DWI · axial · 5.0mm · 1.26mm/px · 1 of 27 slices shown (2 of 2)]
[im 1/27]
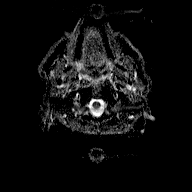

[Series 9: flash_axial · axial · 5.0mm · 0.45mm/px · 1 of 27 slices shown]
[im 1/27]
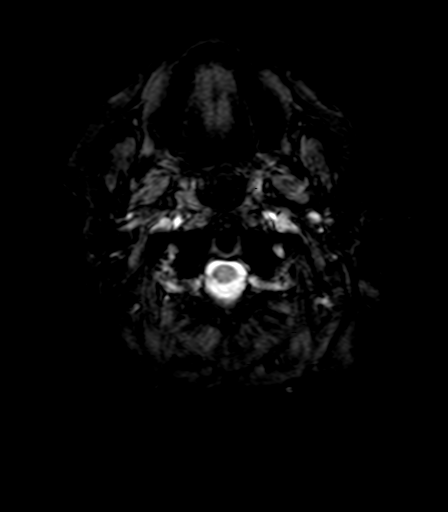

[Series 10: t1_mprage_axial_+c · axial · 1.0mm · 1.00mm/px · z∈[-115,+60]mm · 7 of 176 slices shown]
[im 1/176]
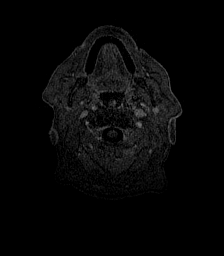
[im 30/176]
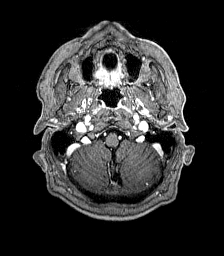
[im 59/176]
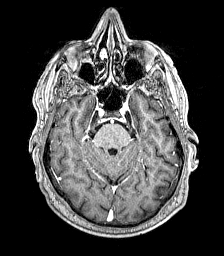
[im 88/176]
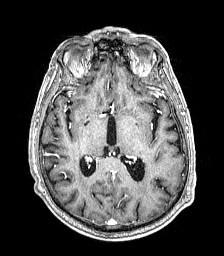
[im 117/176]
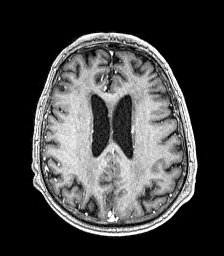
[im 146/176]
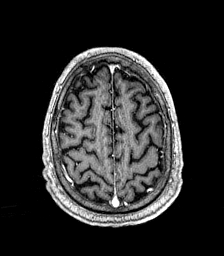
[im 176/176]
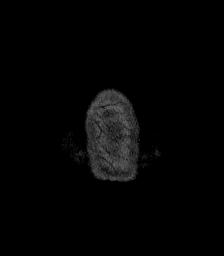

[Series 11: t1_mprage_cor_reformat_+c · coronal · 1.5mm · 1.00mm/px · 8 of 217 slices shown]
[im 1/217]
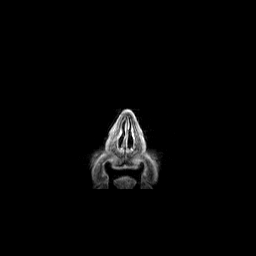
[im 31/217]
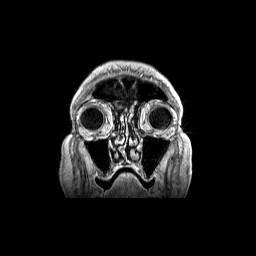
[im 62/217]
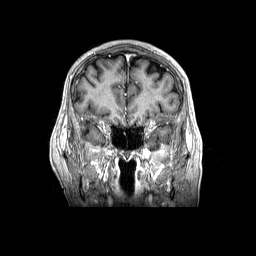
[im 93/217]
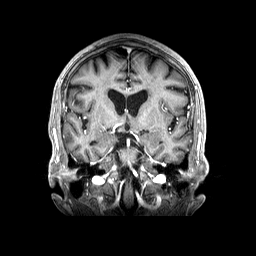
[im 124/217]
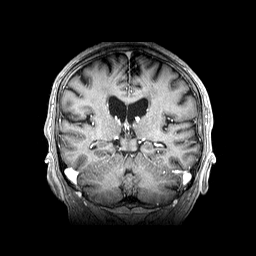
[im 155/217]
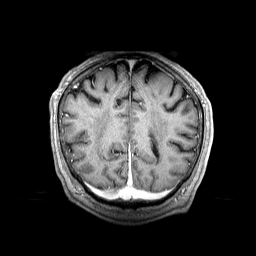
[im 186/217]
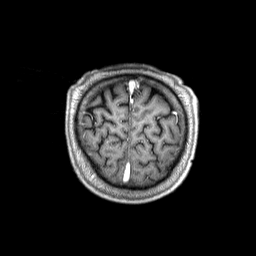
[im 217/217]
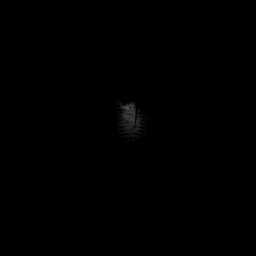

[Series 12: t1_mprage_sag_reformat_+c · sagittal · 1.5mm · 1.12mm/px · 6 of 170 slices shown]
[im 1/170]
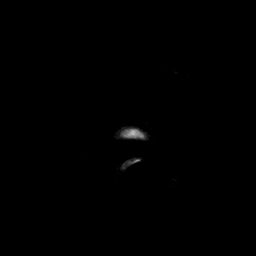
[im 34/170]
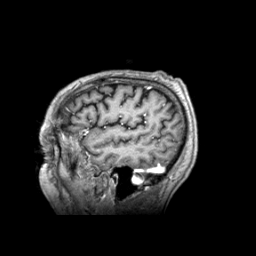
[im 68/170]
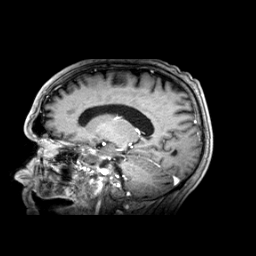
[im 102/170]
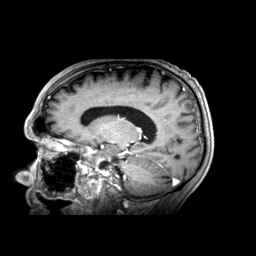
[im 136/170]
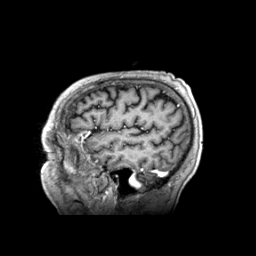
[im 170/170]
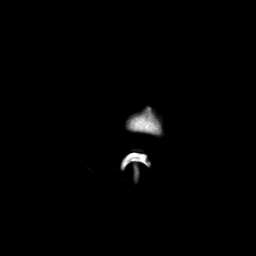

[48 of 48 positions shown; findings below may reference images not displayed]

FINDINGS: Brain parenchyma: Resolution of enhancement associated with right medial parietal lobe and inferior left occipital lobe metastases ([DATE], 50).
Resolution of enhancement associated with right cerebellar metastasis ([DATE]). Residual minimal vasogenic edema. No new abnormal contrast-enhancement. Linear enhancement in the posteromedial left
temporal lobe is likely vascular ([DATE]).
No acute infarct or intracranial hemorrhage. Moderate to severe chronic microhepatic change.
Ventricles: No midline shift, herniation or hydrocephalus.
Extra-axial spaces: No extra-axial fluid collection.
Extracranial structures: Mild mucosal thickening in the ethmoid air cells. Marrow signal normal. Orbits unremarkable. Soft tissues normal.
IMPRESSION: 1.  Resolution of enhancement associated with right right parietal and left occipital metastasis. Near resolution of enhancement associated with right cerebellar metastasis. No new metastatic disease.
2.  Likely benign vascular enhancement in the posteromedial left temporal lobe.

## 2023-02-17 IMAGING — CT PET CT SKULL BASE TO THIGH_RESTAGING
1 of 3 series · 2 of 25 positions shown · non-contrast
Comparison: [HOSPITAL] PET/CT from 09/23/2022 and 02/12/2022 as well as brain MRI from 11/18/2022 and 02/12/2022.

Images Obtained from [facility]
Height: 67 inches. Weight: 172 pounds.
INDICATION: Former smoker with 50 pack year history. Patient quit smoking in February 2022. Patient diagnosed with right small cell lung cancer in Friday February, 2022. Right hilar malignant lymph nodes are
diagnosed with PET/CT imaging dated 02/12/2022. Metastasis to brain demonstrated brain MRI on 02/12/2022 involving right medial parietal lobe right cerebellum as well as questionable lesion at the
inferior left occipital lobe. Patient initially received concurrent chemoradiation to the thorax completed 05/11/2022. Patient then had whole brain radiation completed 12/05/2021. FDG PET/CT restaging
evaluation desired.
TECHNIQUE: The patient's serum glucose was 112 mg/dL at time of the study.  The patient was intravenously injected with 11.10 mCi mCi F-18 Fluorodeoxyglucose in a Left Antecubital Area.  The patient
rested quietly for 68 Minutes minutes and then received attenuation corrected PET/CT imaging with Time of Flight Protocol from the skull vertex to mid thigh. PET, CT, and fused images were reviewed
at the reading station. SUV is corrected based on lean body mass. Images are adequate for review.

[Series 3: ct pet 4.0 br38 · axial · 4.0mm · 0.98mm/px · z∈[-238,-130]mm · 2 of 362 slices shown]
[im 289/362  brain]
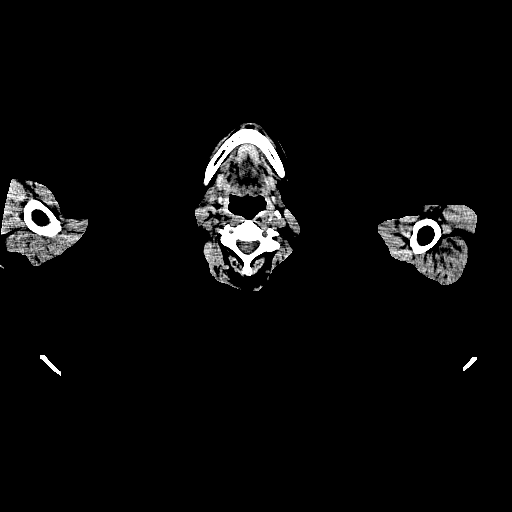
[im 325/362  brain]
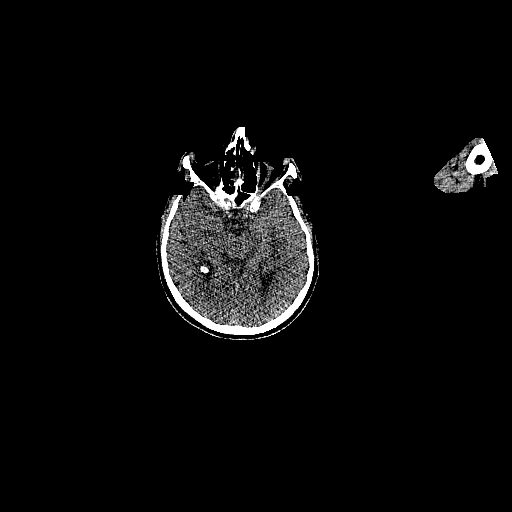

[2 of 25 positions shown; findings below may reference images not displayed]

FINDINGS: HEAD/NECK:
PET: There is normal distribution of the radiopharmaceutical.
CT:  Maxilla and mandible are edentulous. Calcification the carotid bulbs is moderate in degree. Lower anterior cervical spine fusion is stable. Remaining soft tissues the head and neck show
diminutive appearance of the parotid glands with fatty change. Thyroid gland is small in appearance.
THORAX:
PET: The background mediastinal maximum SUV is 2.0. No convincing residual malignant FDG activity is seen at the superior posterior right hilum. No hypermetabolic adenopathy remains within the hilum
or mediastinum.
Low-grade esophagitis is seen in the mid to distal esophagus showing slight interval improvement.
CT: Surgical changes from CABG are noted with intact sternal wires. Coronary artery calcification is present. There is calcification aortic arch and descending thoracic aorta without aneurysm.
Centrilobular emphysematous lung change is stable.
ABDOMEN/PELVIS:
PET:  The background liver mean SUV is  1.7. PERCIST Threshold is 3.0. No malignant FDG activity is seen in the soft tissues of the abdomen or pelvis.
CT: There is diffuse calcification abdominal aorta and its branches the pelvis. Prostate is generous in size with some coarse calcification inferiorly. Appendix is poorly visualized on this exam but
no inflammatory changes at the cecum are identified.
SKELETAL:
PET: There is loss of bone marrow activity within the mid to upper thoracic spine consistent with XRT. No malignant FDG activity is seen within the skeleton.
CT: Degenerative disc disease in the cervical, lower thoracic and lower lumbar spine is noted with posterior element hypertrophy. Anterior cervical spine fusion lower cervical spine is stable.
Surgical changes from CABG are seen with intact sternal wires. Mild osteoarthritis at joints of shoulders and pelvis appear age-related.
IMPRESSION: 1. Today's PET/CT is compared to [HOSPITAL] PET/CT from 09/23/2022 and 02/12/2022 as well as brain MRI from 11/18/2022 and 02/12/2022. No residual malignant FDG activity is seen at the primary
mass lesion at the right hilum or the mediastinal lymph nodes following chemoradiation. No convincing malignant FDG activity is seen within the intracranial contents. However, brain metastases are
better evaluated with contrast-enhanced brain MRI. Patient's considered to be incomplete metabolic resolution based on PET/CT imaging alone.
2.  Centrilobular emphysematous lung change, stable. Mild esophagitis of the mid to distal esophagitis. Surgical changes from CABG. Atherosclerotic peripheral vascular disease without aneurysm.
Prostate hypertrophy.
3. Skeletal arthritic changes as detailed above.

## 2023-05-23 IMAGING — MR MRI BRAIN W/WO CONTRAST
9 of 13 series · 14 of 48 positions shown · IV contrast (prohance)
Comparison: MRI of the brain February 18, 2023

Images Obtained from [facility]
INDICATION: Secondary malignant neoplasm of the brain. Prior small cell lung cancer diagnosed February 2022 with right hilar malignant lymph nodes.
TECHNIQUE: Multiplanar multisequence MR images of the brain were performed before and after the intravenous administration of 15 cc of ProHance.

[Series 1: bSSFP · axial · 8.0mm · 1.37mm/px · 1 of 19 slices shown]
[im 1/19]
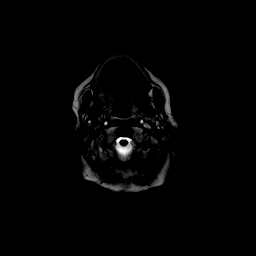

[Series 2: t1_mprage_axial_smbr_recon · axial · 1.0mm · 0.40mm/px · z∈[-43,+132]mm · 6 of 176 slices shown]
[im 1/176]
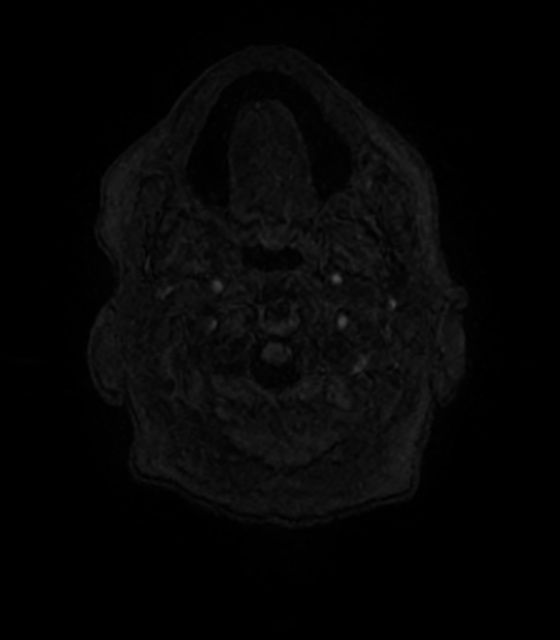
[im 36/176]
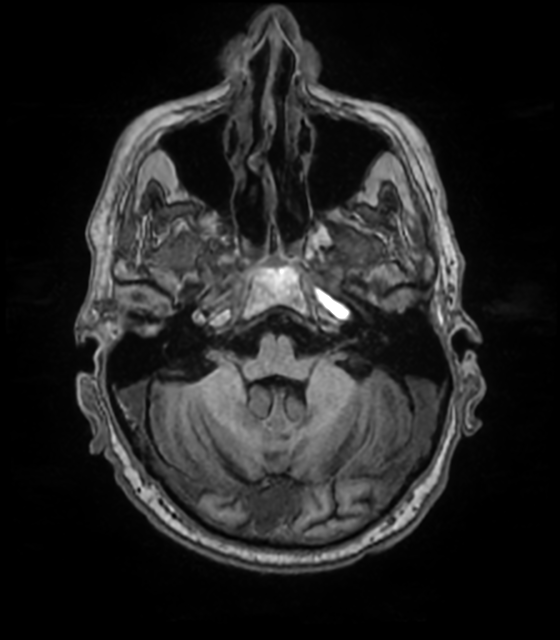
[im 71/176]
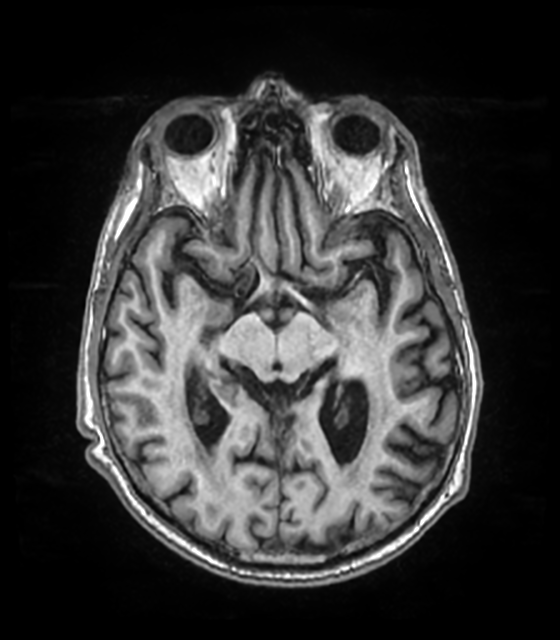
[im 106/176]
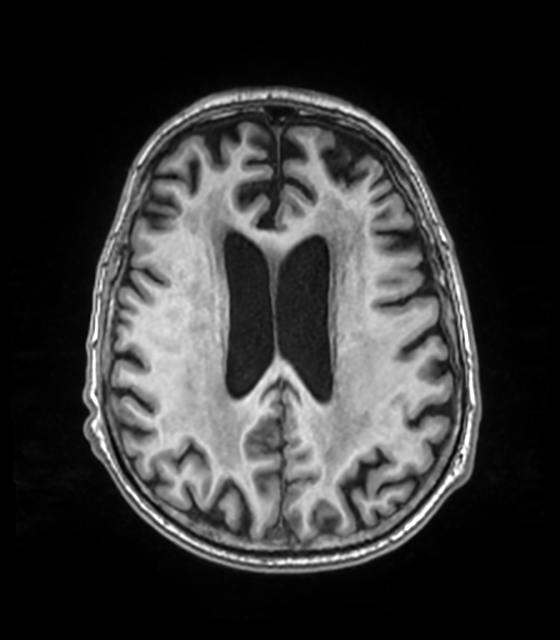
[im 141/176]
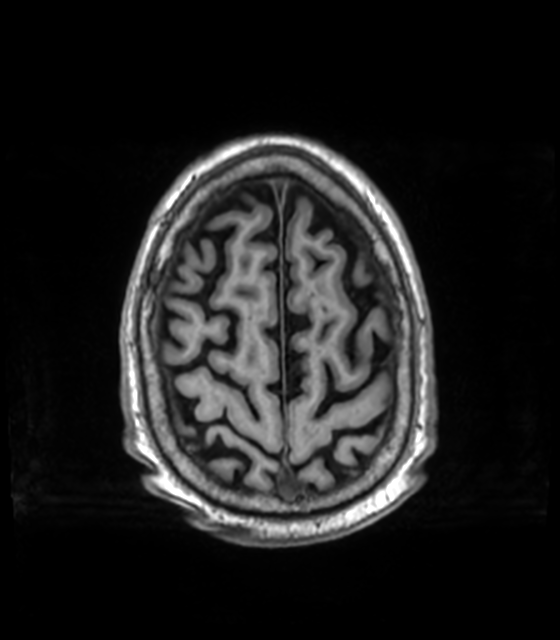
[im 176/176]
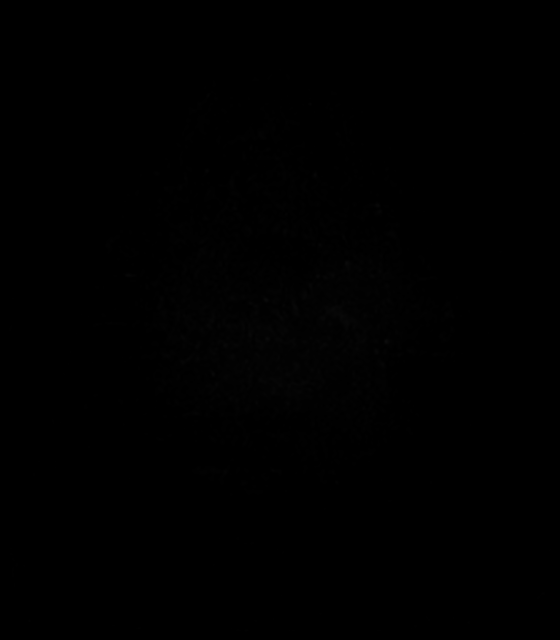

[Series 3: t2_axial_smbr_recon · axial · 5.0mm · 0.36mm/px · 1 of 27 slices shown]
[im 1/27]
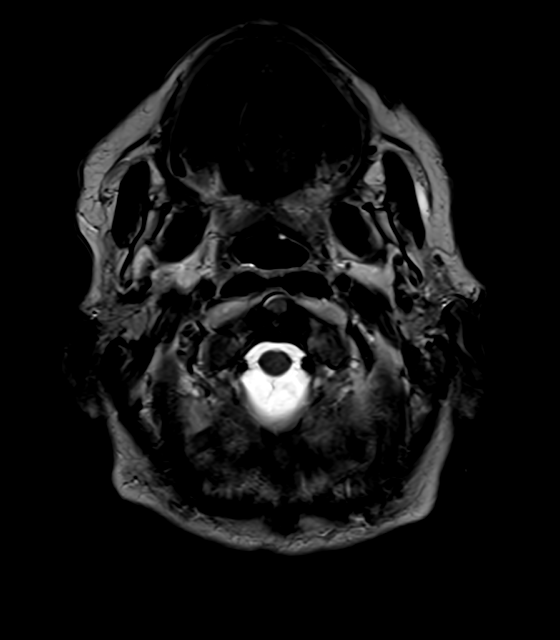

[Series 4: flair_axial_fs_smbr_recon · axial · 5.0mm · 0.36mm/px · 1 of 27 slices shown]
[im 1/27]
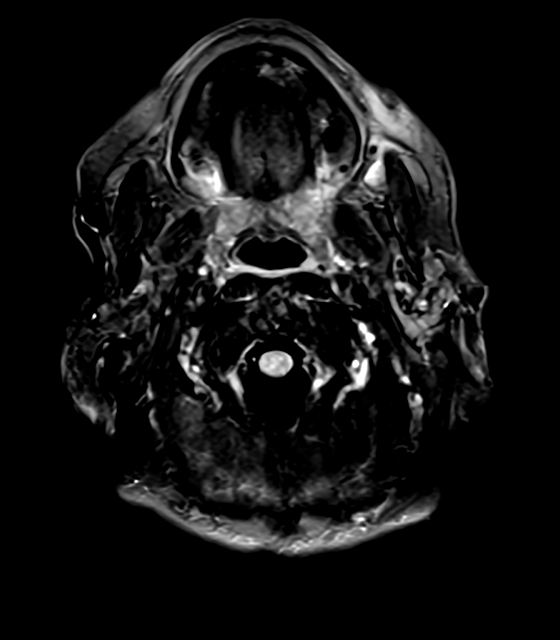

[Series 4: flair_axial_fs_smbr · axial · 5.0mm · 0.90mm/px · 1 of 27 slices shown]
[im 1/27]
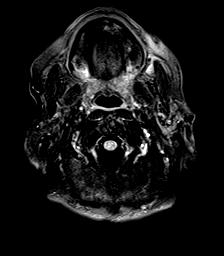

[Series 6: DWI · axial · 5.0mm · 0.38mm/px · 1 of 27 slices shown (1 of 2)]
[im 1/27]
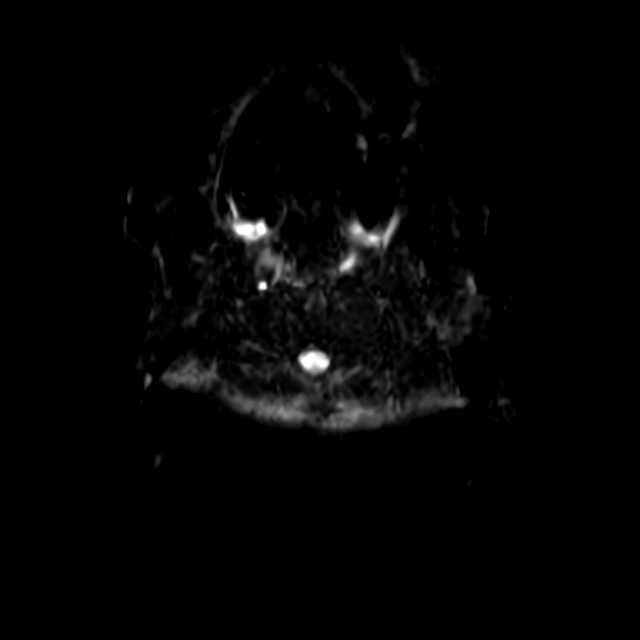

[Series 7: DWI · axial · 5.0mm · 1.26mm/px · 1 of 27 slices shown (2 of 2)]
[im 1/27]
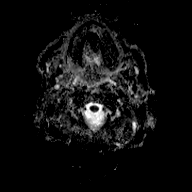

[Series 8: flash_axial_smbr_recon · axial · 5.0mm · 0.36mm/px · 1 of 27 slices shown]
[im 1/27]
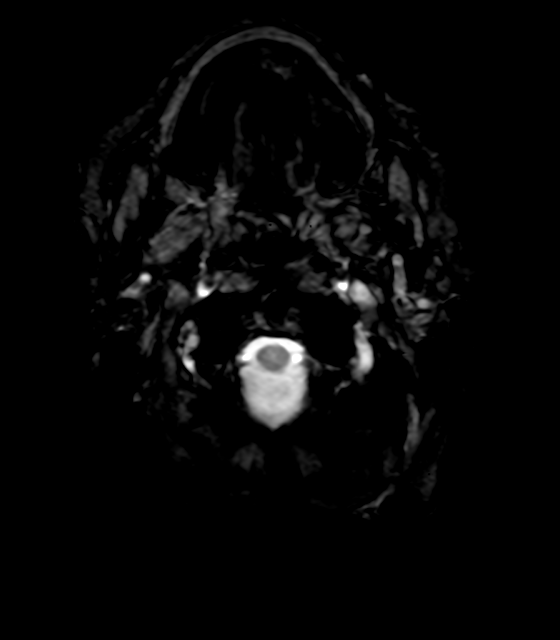

[Series 9: t1_mprage_cor_rft_smbr_recon · coronal · 1.5mm · 0.25mm/px · 1 of 201 slices shown]
[im 1/201]
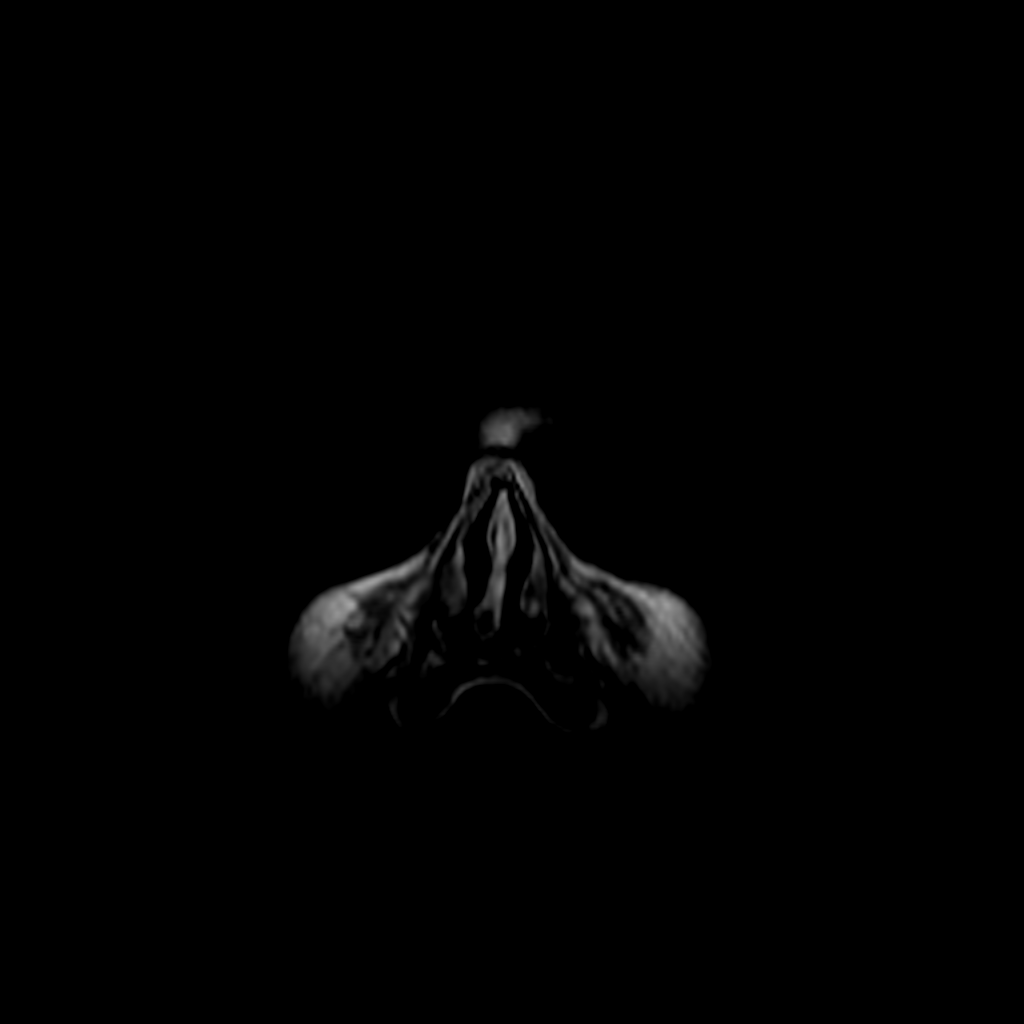

[14 of 48 positions shown; findings below may reference images not displayed]

FINDINGS: There is mild brain atrophy in keeping with the patient's age. There are stable bilateral subcortical/deep periventricular predominantly diffuse increased signal intensity seen on FLAIR and
T2-weighted sequences with ill-defined focal areas of increased signal intensity. Stable faint increased signal intensity seen in the pons. These findings are in keeping with demyelination which may
be due to chronic ischemic changes and/or secondary to therapy from patient's known lung cancer. There is no mass effect, simple the midline structures, ventricular dilatation or extra-axial. The
pituitary gland is of normal size. No suprasellar or parasellar mass lesion is seen. The intracranial arteries have normal signal flow voids. The signal intensity of the bone marrow is normal. The
paranasal sinuses and mastoids are clear.
IMPRESSION: 1.  Stable MRI of the brain with mild brain atrophy in keeping with the patient's age.
2.  Stable bilateral subcortical/deep periventricular demyelination which may be due to chronic ischemic changes and/or secondary to therapy from patient's known lung cancer.
